# Patient Record
Sex: Female | Born: 1935 | Race: White | Hispanic: No
Health system: Southern US, Community
[De-identification: ages and names within clinical notes are randomized; demographics above are authoritative.]

---

## 2007-04-17 ENCOUNTER — Inpatient Hospital Stay: Payer: Self-pay | Admitting: Internal Medicine

## 2007-04-17 ENCOUNTER — Other Ambulatory Visit: Payer: Self-pay

## 2007-05-16 ENCOUNTER — Emergency Department: Payer: Self-pay | Admitting: Emergency Medicine

## 2007-05-22 ENCOUNTER — Emergency Department: Payer: Self-pay | Admitting: Internal Medicine

## 2008-08-31 ENCOUNTER — Inpatient Hospital Stay: Payer: Self-pay | Admitting: Internal Medicine

## 2009-01-10 IMAGING — CT CT HEAD WITHOUT CONTRAST
2 of 4 series · 16 of 30 positions shown, 19 images · non-contrast
Comparison: none

REASON FOR EXAM: fall / loc
COMMENTS:

[Series 2: without · axial · non-contrast · 0.38mm/px · z∈[+891,+1011]mm · 10 of 30 slices shown, 13 images]
[im 3/30  brain]
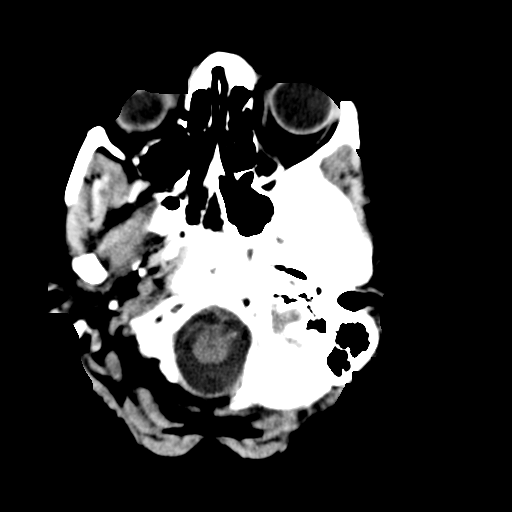
[im 3/30  bone]
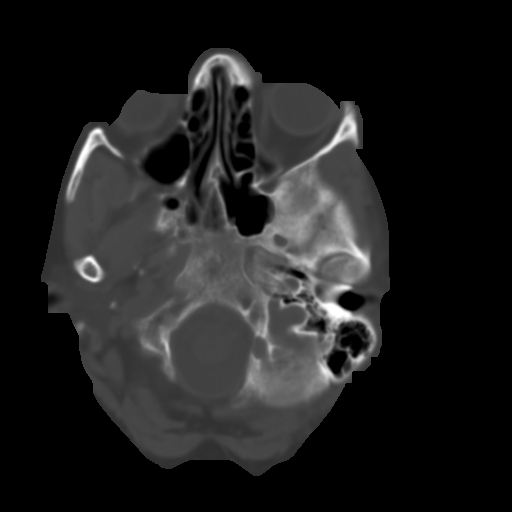
[im 6/30  brain]
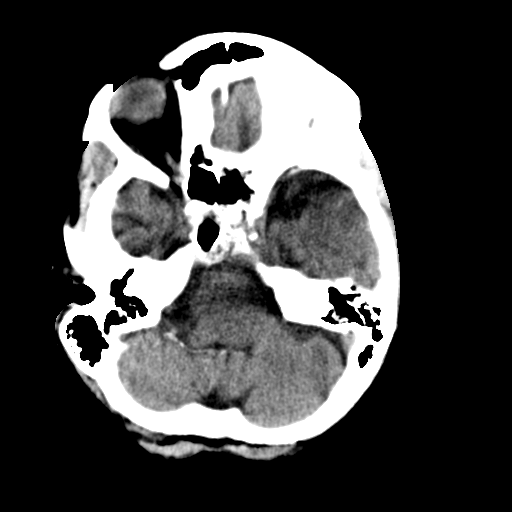
[im 8/30  brain]
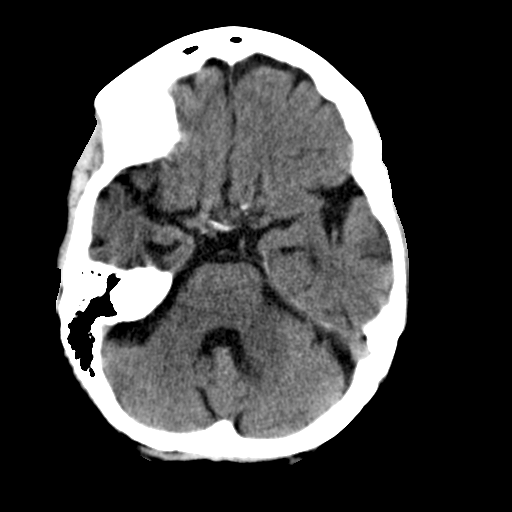
[im 11/30  brain]
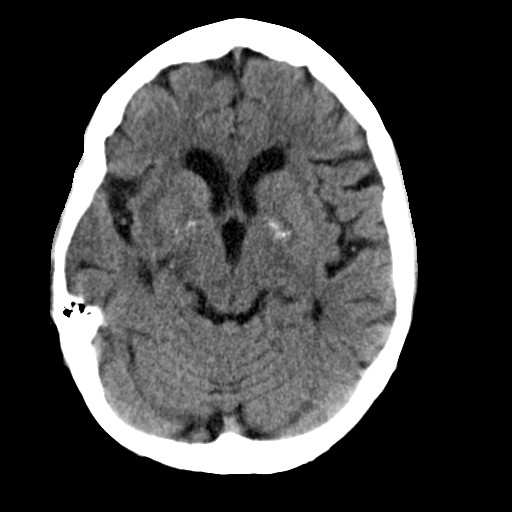
[im 14/30  brain]
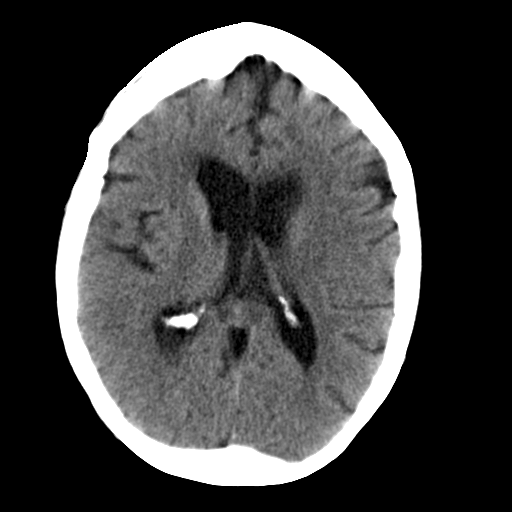
[im 14/30  bone]
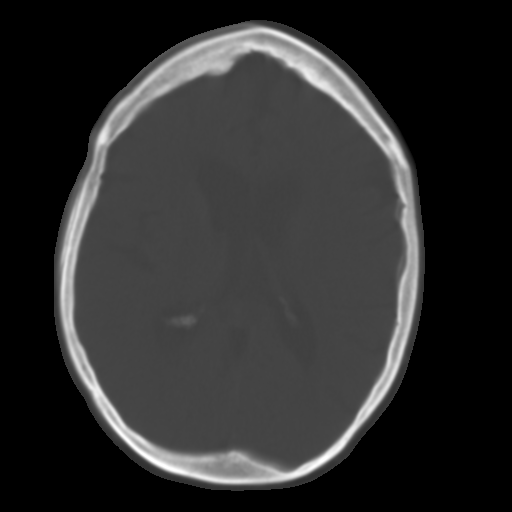
[im 16/30  brain]
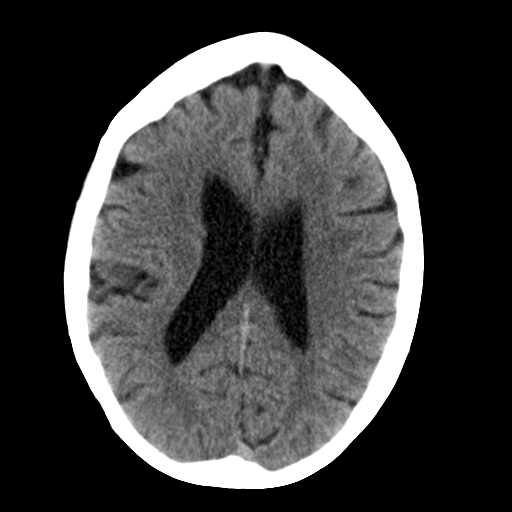
[im 19/30  brain]
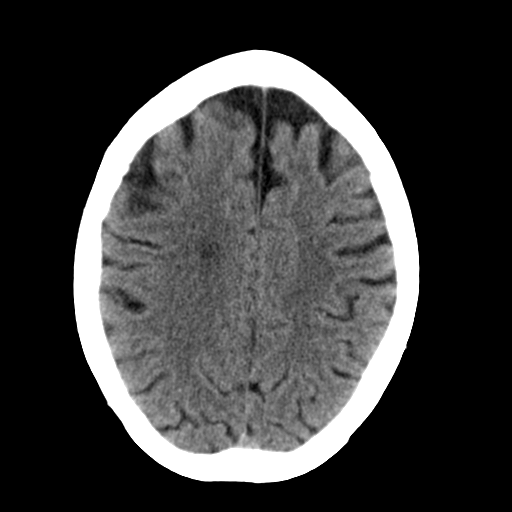
[im 22/30  brain]
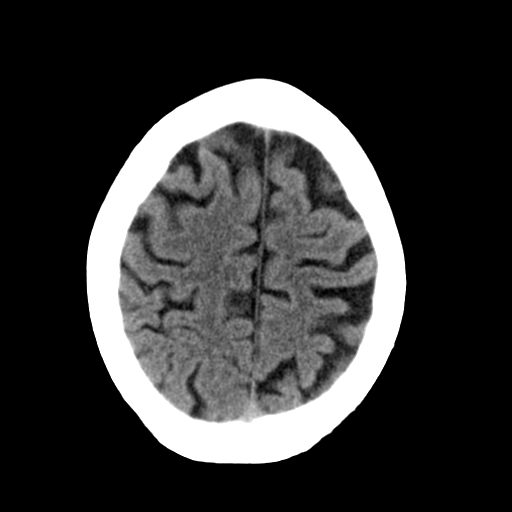
[im 24/30  brain]
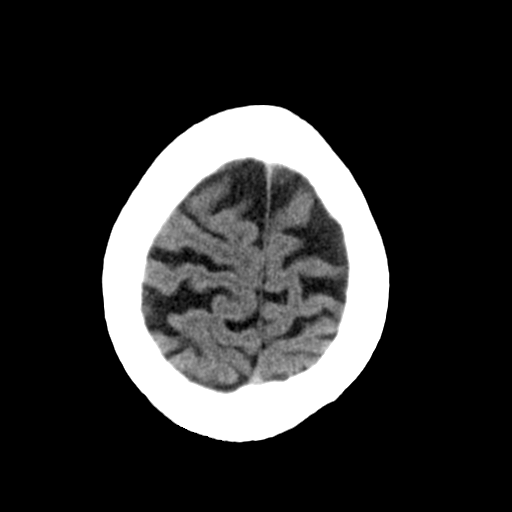
[im 24/30  bone]
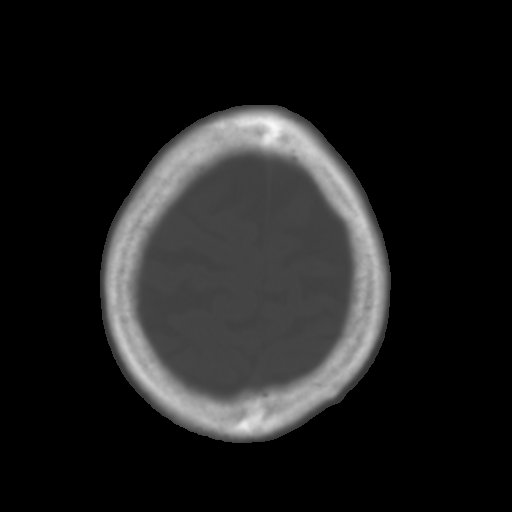
[im 27/30  brain]
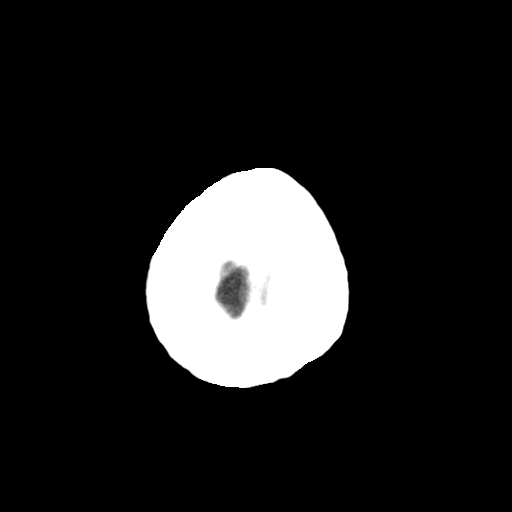

[Series 3: bone · axial · 0.38mm/px · z∈[+891,+971]mm · 6 of 30 slices shown]
[im 3/30  bone]
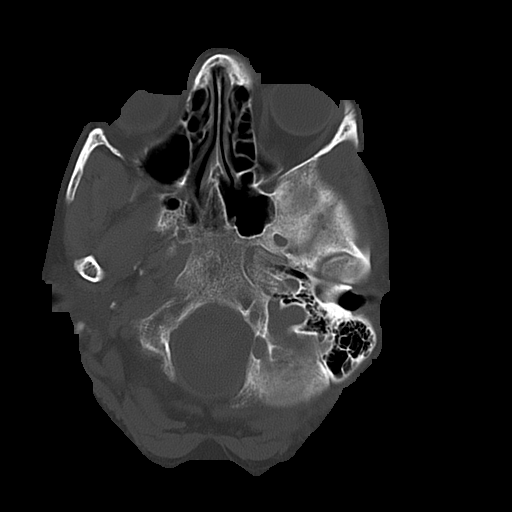
[im 6/30  bone]
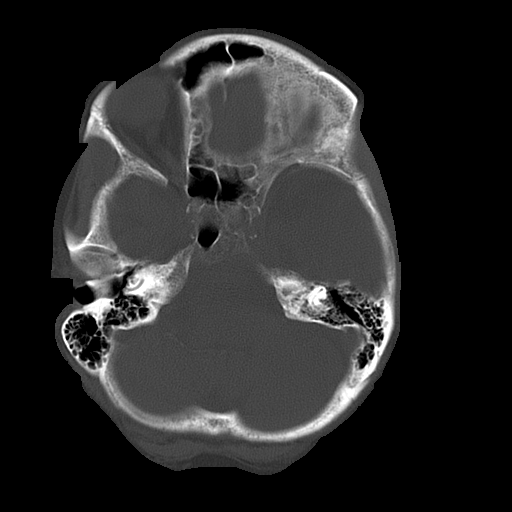
[im 11/30  bone]
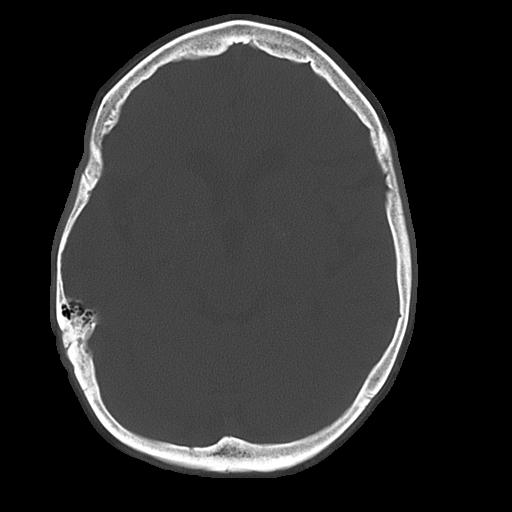
[im 14/30  bone]
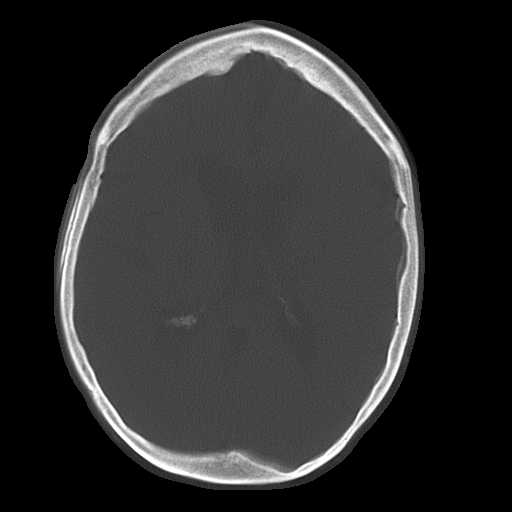
[im 16/30  bone]
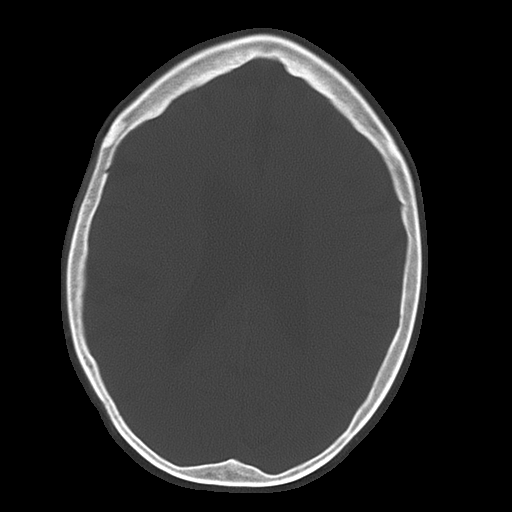
[im 19/30  bone]
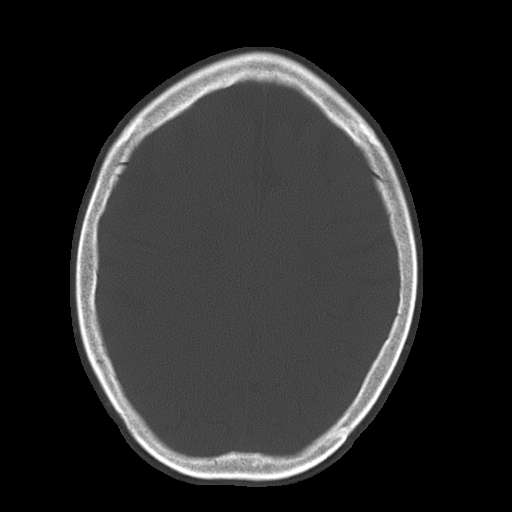

[16 of 30 positions shown; findings below may reference images not displayed]

PROCEDURE:     CT  - CT HEAD WITHOUT CONTRAST  - May 22, 2007  [DATE]

RESULT:     This study was compared to a previous study dated 04-17-07.

There is no evidence of intra-axial nor extra-axial fluid collections nor
evidence of acute hemorrhage. No secondary signs are appreciated to suggest
mass-effect, subacute or chronic infarction. The visualized bony skeleton
demonstrates no evidence of fracture or dislocation. There is diffuse
cortical atrophy and areas of low attenuation within the subcortical deep
and periventricular white matter regions. Basal ganglia calcifications are
identified.
IMPRESSION: 1.Involutional changes without evidence of focal or acute abnormalities.

Dr. Aujla of the Emergency Room was informed of these findings at the
time of the initial interpretation.

## 2009-07-26 ENCOUNTER — Inpatient Hospital Stay: Payer: Self-pay | Admitting: Unknown Physician Specialty

## 2009-07-30 ENCOUNTER — Encounter: Payer: Self-pay | Admitting: Internal Medicine

## 2009-08-17 ENCOUNTER — Emergency Department: Payer: Self-pay | Admitting: Emergency Medicine

## 2009-08-18 ENCOUNTER — Encounter: Payer: Self-pay | Admitting: Internal Medicine

## 2009-09-16 ENCOUNTER — Encounter: Payer: Self-pay | Admitting: Internal Medicine

## 2010-04-30 IMAGING — CR DG CHEST 2V
1 series · 2 of 2 positions shown · non-contrast
Comparison: none

REASON FOR EXAM: cough/fu chest
COMMENTS:

PROCEDURE:     DXR - DXR CHEST PA (OR AP) AND LATERAL  - September 08, 2008 [DATE]
RESULT:     Comparison: 09/06/2008

[Series 1: view not recorded · 0.17mm/px · 2 of 2 slices shown]
[im 1/2]
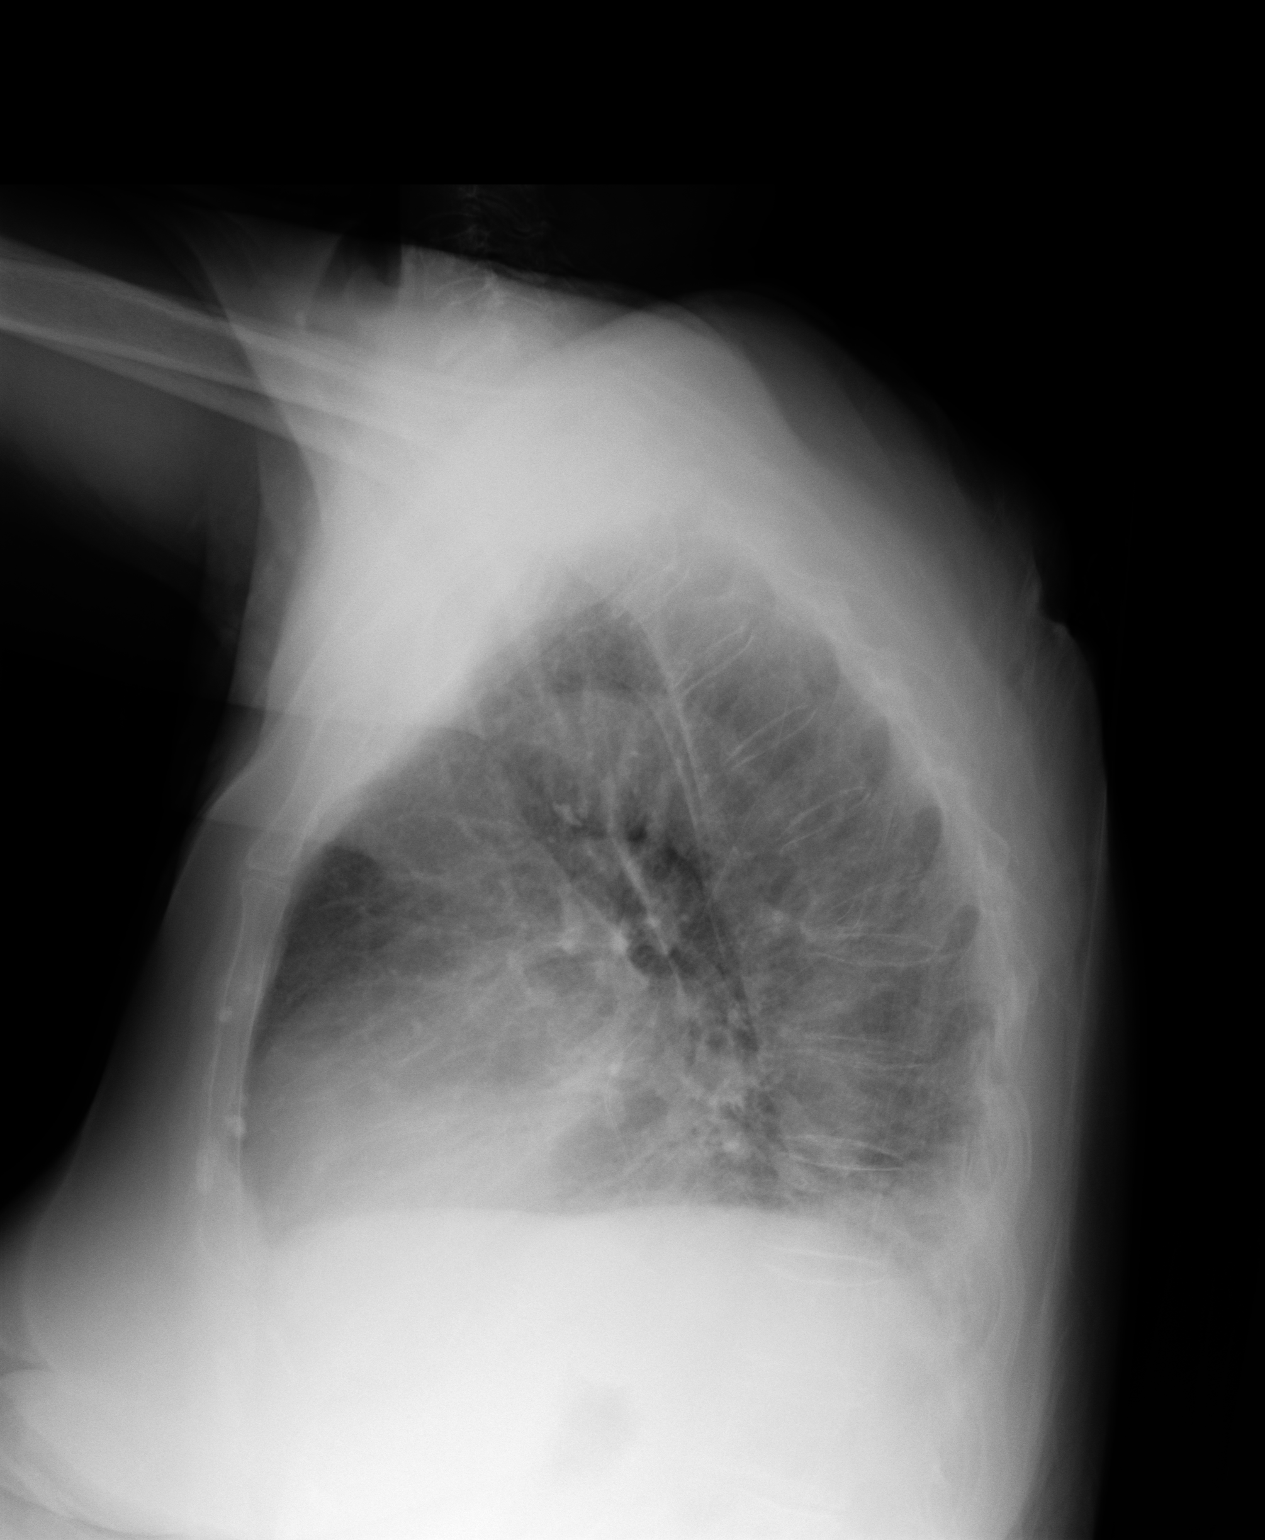
[im 2/2]
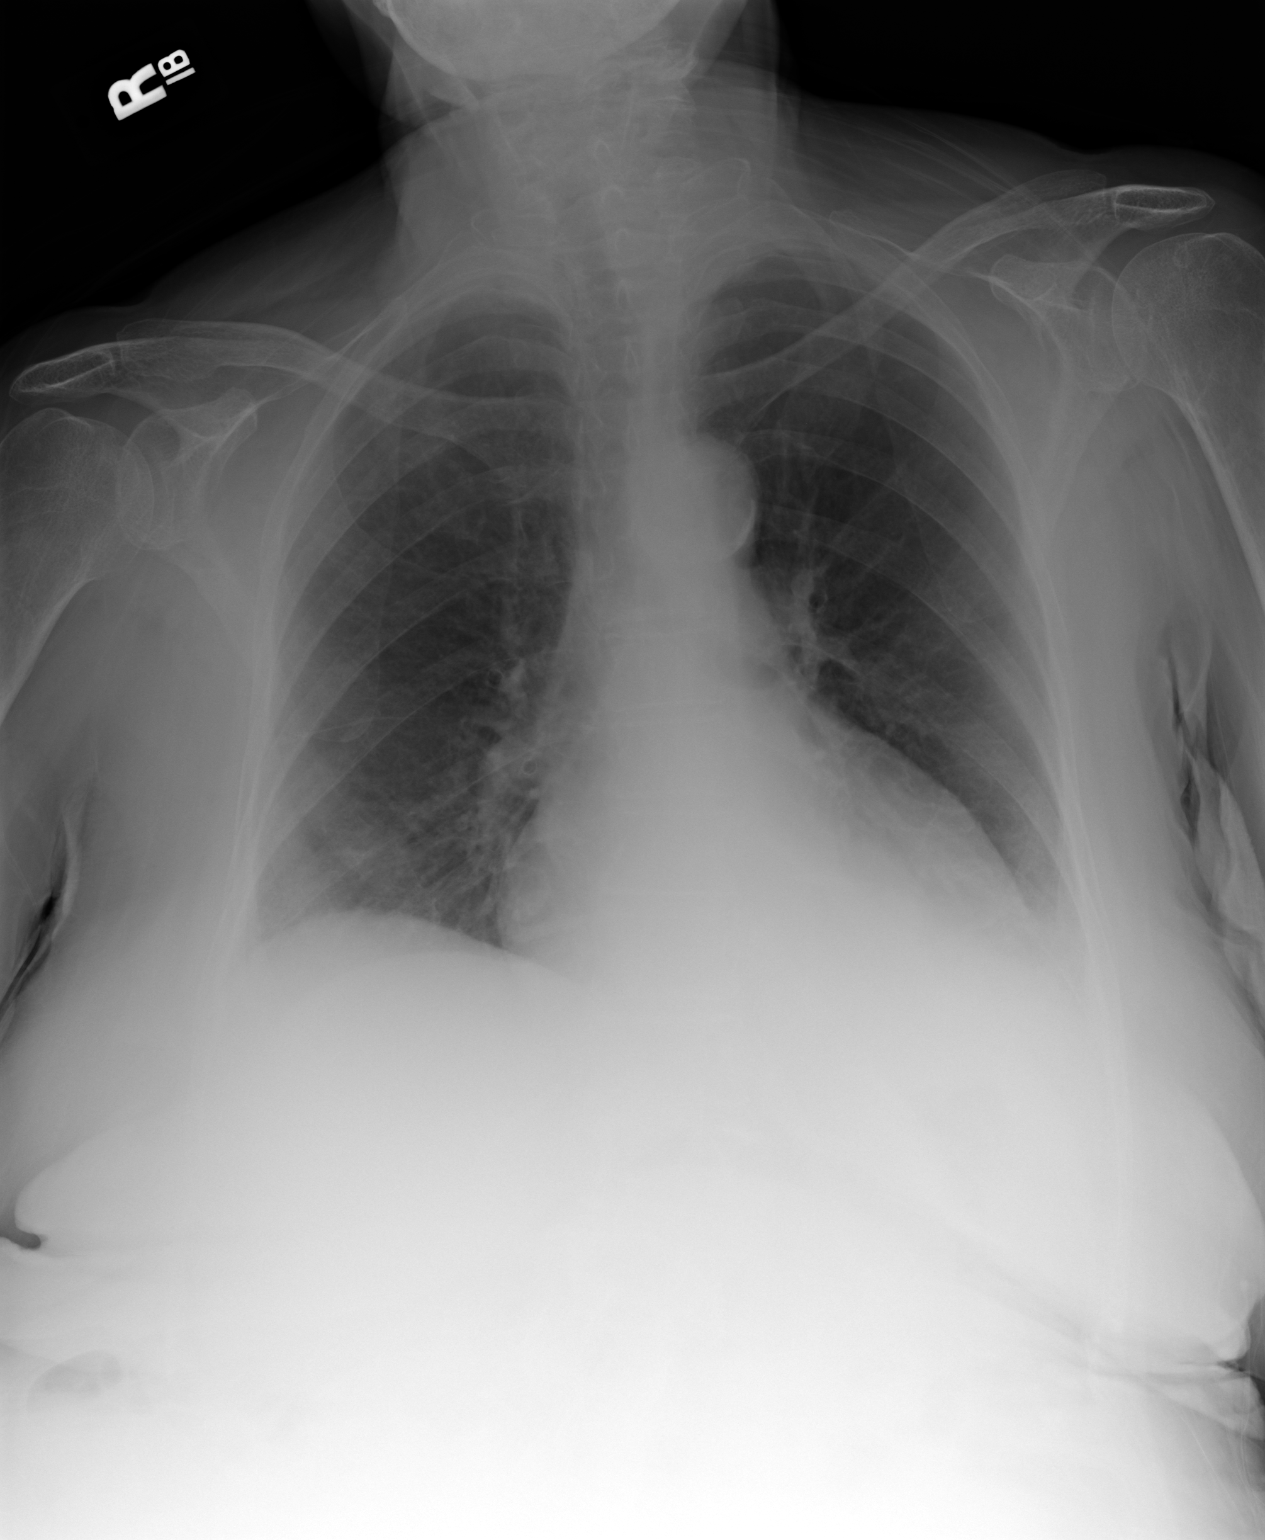

[2 of 2 positions shown; findings below may reference images not displayed]

FINDINGS: PA and lateral chest radiographs are provided. There is persistent small
area of airspace disease in the left lower lobe posteriorly concerning for
an infiltrate. The right lung is clear. There is no pneumothorax. The heart
and mediastinum are unremarkable. The osseous structures are unremarkable.
IMPRESSION: Left lower lobe airspace disease concerning for an infiltrate.

## 2010-11-23 ENCOUNTER — Emergency Department: Payer: Self-pay | Admitting: Internal Medicine

## 2011-01-22 ENCOUNTER — Emergency Department: Payer: Self-pay | Admitting: Emergency Medicine

## 2011-07-18 ENCOUNTER — Ambulatory Visit: Payer: Self-pay | Admitting: Internal Medicine

## 2011-07-30 ENCOUNTER — Inpatient Hospital Stay: Payer: Self-pay | Admitting: Internal Medicine

## 2011-07-30 LAB — URINALYSIS, COMPLETE
Bilirubin,UR: NEGATIVE
Blood: NEGATIVE
Glucose,UR: NEGATIVE mg/dL (ref 0–75)
Ketone: NEGATIVE
Nitrite: POSITIVE
Ph: 6 (ref 4.5–8.0)
RBC,UR: 3 /HPF (ref 0–5)
Specific Gravity: 1.018 (ref 1.003–1.030)
Squamous Epithelial: 1

## 2011-07-30 LAB — COMPREHENSIVE METABOLIC PANEL
Anion Gap: 11 (ref 7–16)
Calcium, Total: 9 mg/dL (ref 8.5–10.1)
Chloride: 106 mmol/L (ref 98–107)
Co2: 24 mmol/L (ref 21–32)
Creatinine: 0.93 mg/dL (ref 0.60–1.30)
EGFR (African American): >60
EGFR (Non-African Amer.): 55 — ABNORMAL LOW
Glucose: 100 mg/dL — ABNORMAL HIGH (ref 65–99)
Osmolality: 283 (ref 275–301)
Potassium: 4.4 mmol/L (ref 3.5–5.1)
SGPT (ALT): 20 U/L
Total Protein: 7.7 g/dL (ref 6.4–8.2)

## 2011-07-30 LAB — TROPONIN I: Troponin-I: <0.02 ng/mL

## 2011-07-30 LAB — PROTIME-INR: Prothrombin Time: 12.9 secs (ref 11.5–14.7)

## 2011-07-30 LAB — CBC
MCV: 99 fL (ref 80–100)
Platelet: 209 10*3/uL (ref 150–440)

## 2011-07-31 LAB — CBC WITH DIFFERENTIAL/PLATELET
Basophil #: 0.1 10*3/uL (ref 0.0–0.1)
Eosinophil %: 0.6 %
HCT: 41.3 % (ref 35.0–47.0)
HGB: 13.7 g/dL (ref 12.0–16.0)
MCH: 32.4 pg (ref 26.0–34.0)
Neutrophil %: 76.2 %
Platelet: 183 10*3/uL (ref 150–440)
RBC: 4.23 10*6/uL (ref 3.80–5.20)
WBC: 8.9 10*3/uL (ref 3.6–11.0)

## 2011-07-31 LAB — BASIC METABOLIC PANEL
Anion Gap: 10 (ref 7–16)
BUN: 18 mg/dL (ref 7–18)
Co2: 24 mmol/L (ref 21–32)
Creatinine: 0.89 mg/dL (ref 0.60–1.30)
EGFR (African American): >60
EGFR (Non-African Amer.): 58 — ABNORMAL LOW
Glucose: 109 mg/dL — ABNORMAL HIGH (ref 65–99)
Sodium: 141 mmol/L (ref 136–145)

## 2011-07-31 LAB — HEMOGLOBIN
HGB: 13.8 g/dL (ref 12.0–16.0)
HGB: 13.2 g/dL (ref 12.0–16.0)

## 2011-08-01 LAB — HEMOGLOBIN
HGB: 12.5 g/dL (ref 12.0–16.0)
HGB: 13.1 g/dL (ref 12.0–16.0)

## 2011-08-02 LAB — BASIC METABOLIC PANEL
Anion Gap: 6 — ABNORMAL LOW (ref 7–16)
BUN: 13 mg/dL (ref 7–18)
Calcium, Total: 8.1 mg/dL — ABNORMAL LOW (ref 8.5–10.1)
Chloride: 109 mmol/L — ABNORMAL HIGH (ref 98–107)
Co2: 25 mmol/L (ref 21–32)
Creatinine: 0.89 mg/dL (ref 0.60–1.30)
EGFR (Non-African Amer.): 58 — ABNORMAL LOW
Glucose: 109 mg/dL — ABNORMAL HIGH (ref 65–99)

## 2011-08-02 LAB — CBC WITH DIFFERENTIAL/PLATELET
Basophil #: 0.1 10*3/uL (ref 0.0–0.1)
Basophil %: 1.4 %
Eosinophil %: 0.7 %
HGB: 13 g/dL (ref 12.0–16.0)
Lymphocyte #: 0.9 10*3/uL — ABNORMAL LOW (ref 1.0–3.6)
Lymphocyte %: 9.5 %
MCH: 32.2 pg (ref 26.0–34.0)
Monocyte %: 10.4 %
Neutrophil #: 7.4 10*3/uL — ABNORMAL HIGH (ref 1.4–6.5)
Neutrophil %: 78 %
Platelet: 164 10*3/uL (ref 150–440)
RDW: 13.6 % (ref 11.5–14.5)
WBC: 9.5 10*3/uL (ref 3.6–11.0)

## 2011-08-02 LAB — HEMOGLOBIN: HGB: 13.7 g/dL (ref 12.0–16.0)

## 2011-08-03 LAB — URINE CULTURE

## 2011-08-03 LAB — HEMOGLOBIN: HGB: 13.5 g/dL (ref 12.0–16.0)

## 2011-08-05 LAB — CULTURE, BLOOD (SINGLE)

## 2011-08-08 ENCOUNTER — Inpatient Hospital Stay: Payer: Self-pay | Admitting: Internal Medicine

## 2011-08-08 LAB — BASIC METABOLIC PANEL
Anion Gap: 6 — ABNORMAL LOW (ref 7–16)
BUN: 18 mg/dL (ref 7–18)
Chloride: 106 mmol/L (ref 98–107)
Co2: 26 mmol/L (ref 21–32)
EGFR (African American): >60
EGFR (Non-African Amer.): >60
Osmolality: 278 (ref 275–301)

## 2011-08-08 LAB — CBC
MCH: 31.4 pg (ref 26.0–34.0)
MCHC: 32.7 g/dL (ref 32.0–36.0)
WBC: 13.5 10*3/uL — ABNORMAL HIGH (ref 3.6–11.0)

## 2011-08-08 LAB — URINALYSIS, COMPLETE
Bacteria: NONE SEEN
Blood: NEGATIVE
Glucose,UR: NEGATIVE mg/dL (ref 0–75)
Ketone: NEGATIVE
Nitrite: NEGATIVE
Ph: 5 (ref 4.5–8.0)
Specific Gravity: 1.024 (ref 1.003–1.030)
Squamous Epithelial: 1

## 2011-08-08 LAB — TROPONIN I: Troponin-I: <0.02 ng/mL

## 2011-08-08 LAB — PRO B NATRIURETIC PEPTIDE: B-Type Natriuretic Peptide: 1530 pg/mL — ABNORMAL HIGH (ref 0–450)

## 2011-08-08 LAB — CK TOTAL AND CKMB (NOT AT ARMC)
CK, Total: 30 U/L (ref 21–215)
CK-MB: 0.8 ng/mL (ref 0.5–3.6)

## 2011-08-08 LAB — TSH: Thyroid Stimulating Horm: 2.9 u[IU]/mL

## 2011-08-09 LAB — COMPREHENSIVE METABOLIC PANEL
Albumin: 2.5 g/dL — ABNORMAL LOW (ref 3.4–5.0)
Alkaline Phosphatase: 91 U/L (ref 50–136)
Anion Gap: 9 (ref 7–16)
Calcium, Total: 8.5 mg/dL (ref 8.5–10.1)
Co2: 25 mmol/L (ref 21–32)
Creatinine: 0.94 mg/dL (ref 0.60–1.30)
EGFR (African American): >60
EGFR (Non-African Amer.): 54 — ABNORMAL LOW
Glucose: 109 mg/dL — ABNORMAL HIGH (ref 65–99)
SGPT (ALT): 17 U/L
Sodium: 137 mmol/L (ref 136–145)

## 2011-08-09 LAB — CBC WITH DIFFERENTIAL/PLATELET
Basophil #: 0.1 10*3/uL (ref 0.0–0.1)
Basophil %: 0.7 %
Eosinophil #: 0.2 10*3/uL (ref 0.0–0.7)
Eosinophil %: 2 %
HCT: 37.4 % (ref 35.0–47.0)
HGB: 12.3 g/dL (ref 12.0–16.0)
Lymphocyte #: 0.6 10*3/uL — ABNORMAL LOW (ref 1.0–3.6)
Lymphocyte %: 6.6 %
MCHC: 32.8 g/dL (ref 32.0–36.0)
Monocyte %: 9 %
Neutrophil #: 7.7 10*3/uL — ABNORMAL HIGH (ref 1.4–6.5)
Neutrophil %: 81.7 %
Platelet: 233 10*3/uL (ref 150–440)
RDW: 13.3 % (ref 11.5–14.5)
WBC: 9.4 10*3/uL (ref 3.6–11.0)

## 2011-08-09 LAB — CK TOTAL AND CKMB (NOT AT ARMC): CK-MB: 0.8 ng/mL (ref 0.5–3.6)

## 2011-08-09 LAB — TROPONIN I: Troponin-I: <0.02 ng/mL

## 2011-08-10 LAB — CBC WITH DIFFERENTIAL/PLATELET
Basophil #: 0.1 10*3/uL (ref 0.0–0.1)
Eosinophil %: 1.8 %
HCT: 39.6 % (ref 35.0–47.0)
HGB: 13 g/dL (ref 12.0–16.0)
Lymphocyte #: 0.8 10*3/uL — ABNORMAL LOW (ref 1.0–3.6)
Lymphocyte %: 8.8 %
MCHC: 32.9 g/dL (ref 32.0–36.0)
MCV: 95 fL (ref 80–100)
Monocyte #: 0.8 x10 3/mm (ref 0.2–0.9)
Monocyte %: 9.9 %
Neutrophil #: 6.7 10*3/uL — ABNORMAL HIGH (ref 1.4–6.5)
Neutrophil %: 78.5 %
RDW: 13.3 % (ref 11.5–14.5)

## 2011-08-10 LAB — PRO B NATRIURETIC PEPTIDE: B-Type Natriuretic Peptide: 825 pg/mL — ABNORMAL HIGH (ref 0–450)

## 2011-08-11 LAB — BASIC METABOLIC PANEL
Anion Gap: 13 (ref 7–16)
Calcium, Total: 8.8 mg/dL (ref 8.5–10.1)
Chloride: 98 mmol/L (ref 98–107)
Creatinine: 1 mg/dL (ref 0.60–1.30)
EGFR (Non-African Amer.): 50 — ABNORMAL LOW
Osmolality: 280 (ref 275–301)
Potassium: 3.2 mmol/L — ABNORMAL LOW (ref 3.5–5.1)

## 2011-08-11 LAB — PRO B NATRIURETIC PEPTIDE: B-Type Natriuretic Peptide: 1127 pg/mL — ABNORMAL HIGH (ref 0–450)

## 2011-08-14 LAB — CULTURE, BLOOD (SINGLE)

## 2011-08-18 ENCOUNTER — Ambulatory Visit: Payer: Self-pay | Admitting: Internal Medicine

## 2011-09-17 DEATH — deceased

## 2013-03-29 IMAGING — CR DG LUMBAR SPINE 2-3V
1 series · 4 of 4 positions shown · non-contrast
Comparison: none

REASON FOR EXAM: severe back pain w/ movement
COMMENTS:

[Series 3: t lumbar spine ap · 0.14mm/px · 4 of 4 slices shown]
[im 1/4]
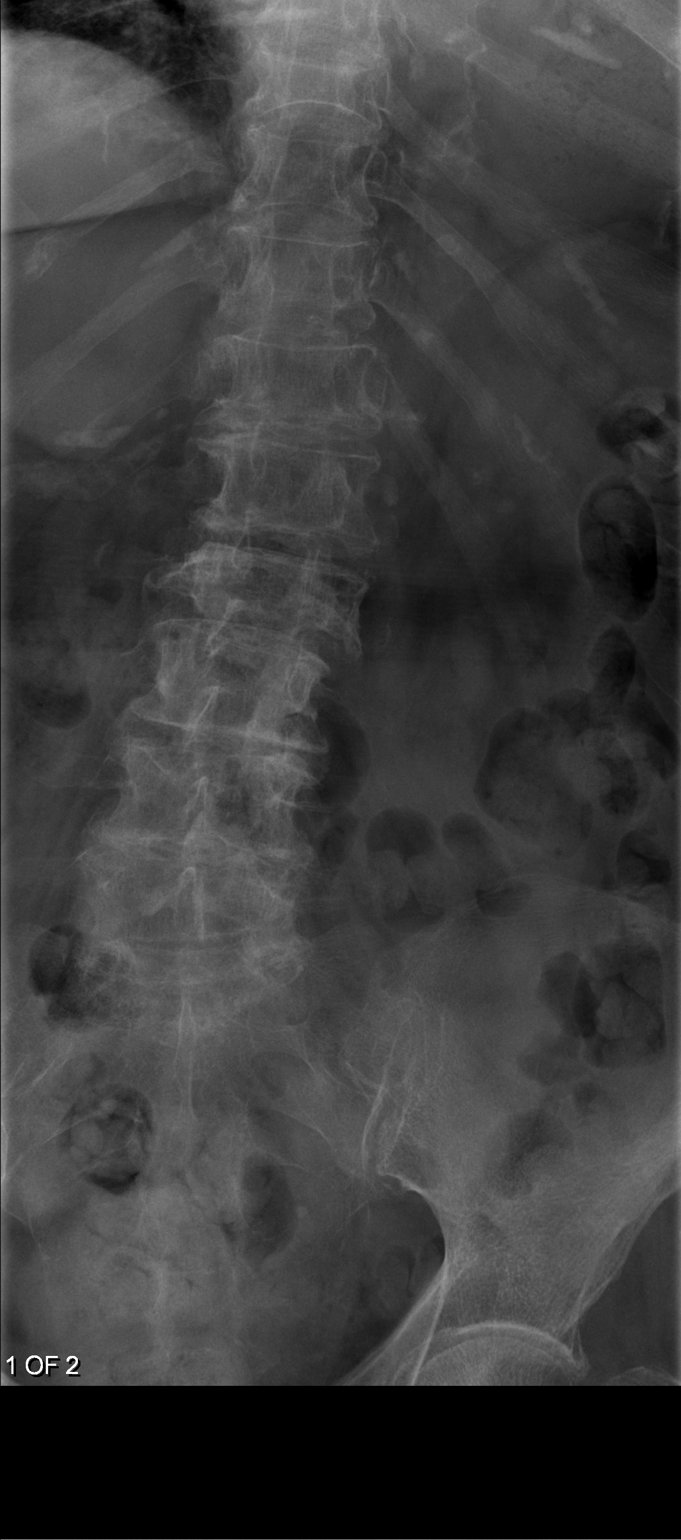
[im 2/4]
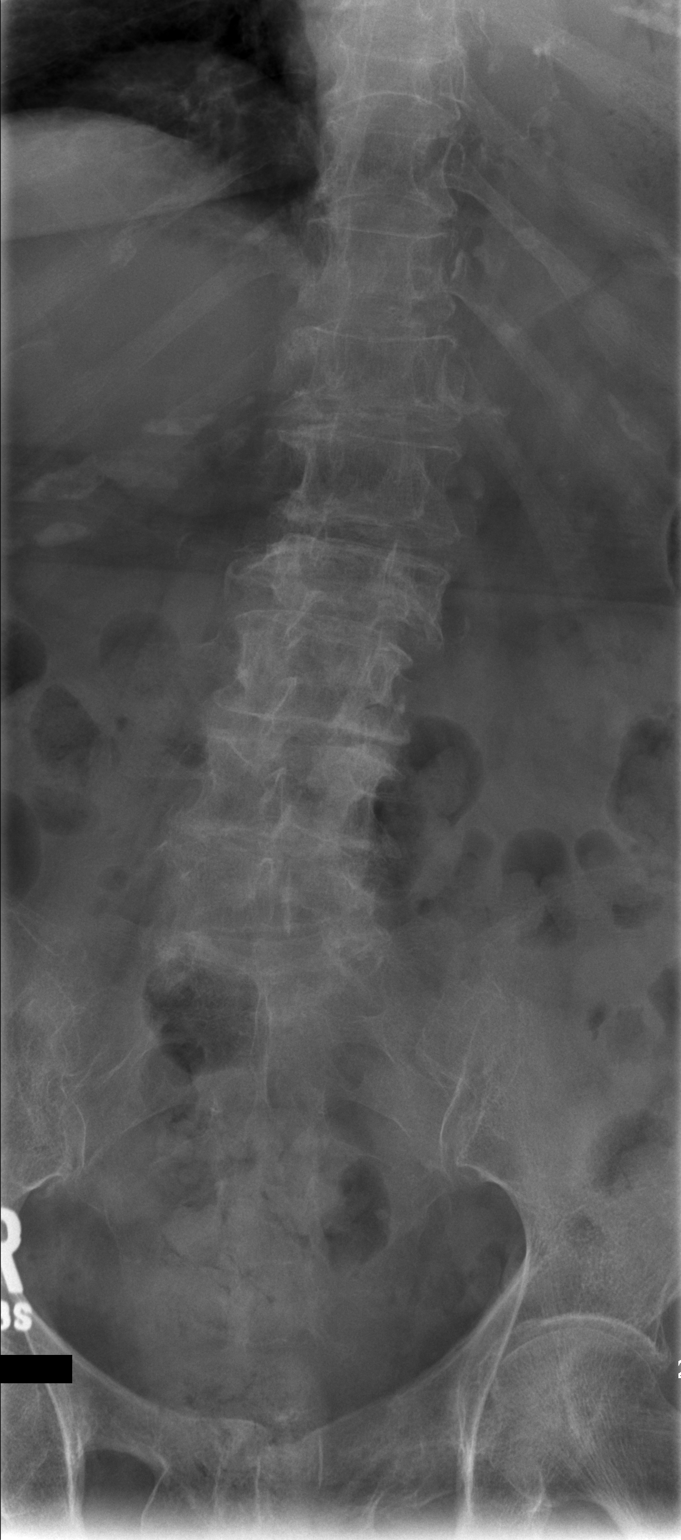
[im 3/4]
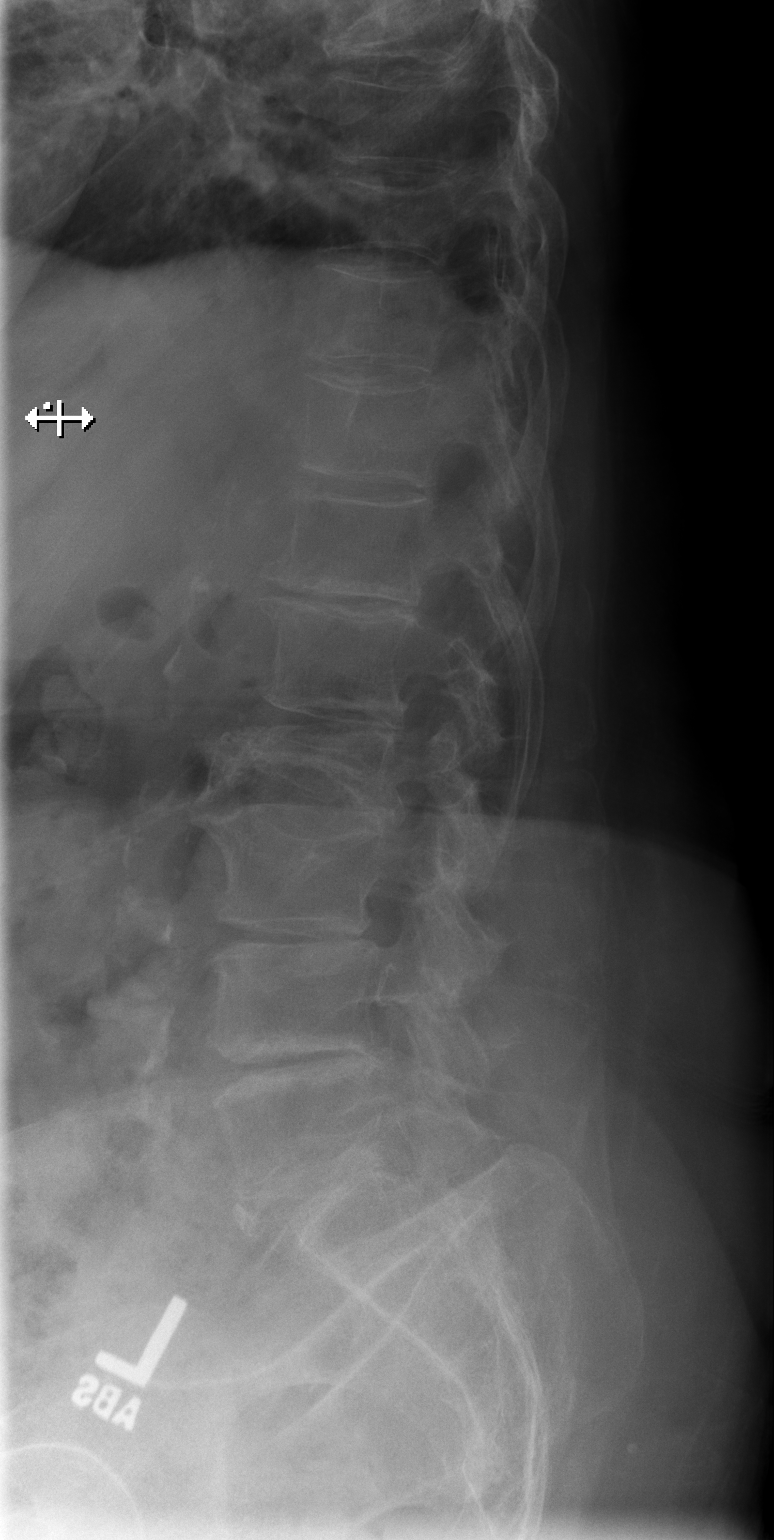
[im 4/4]
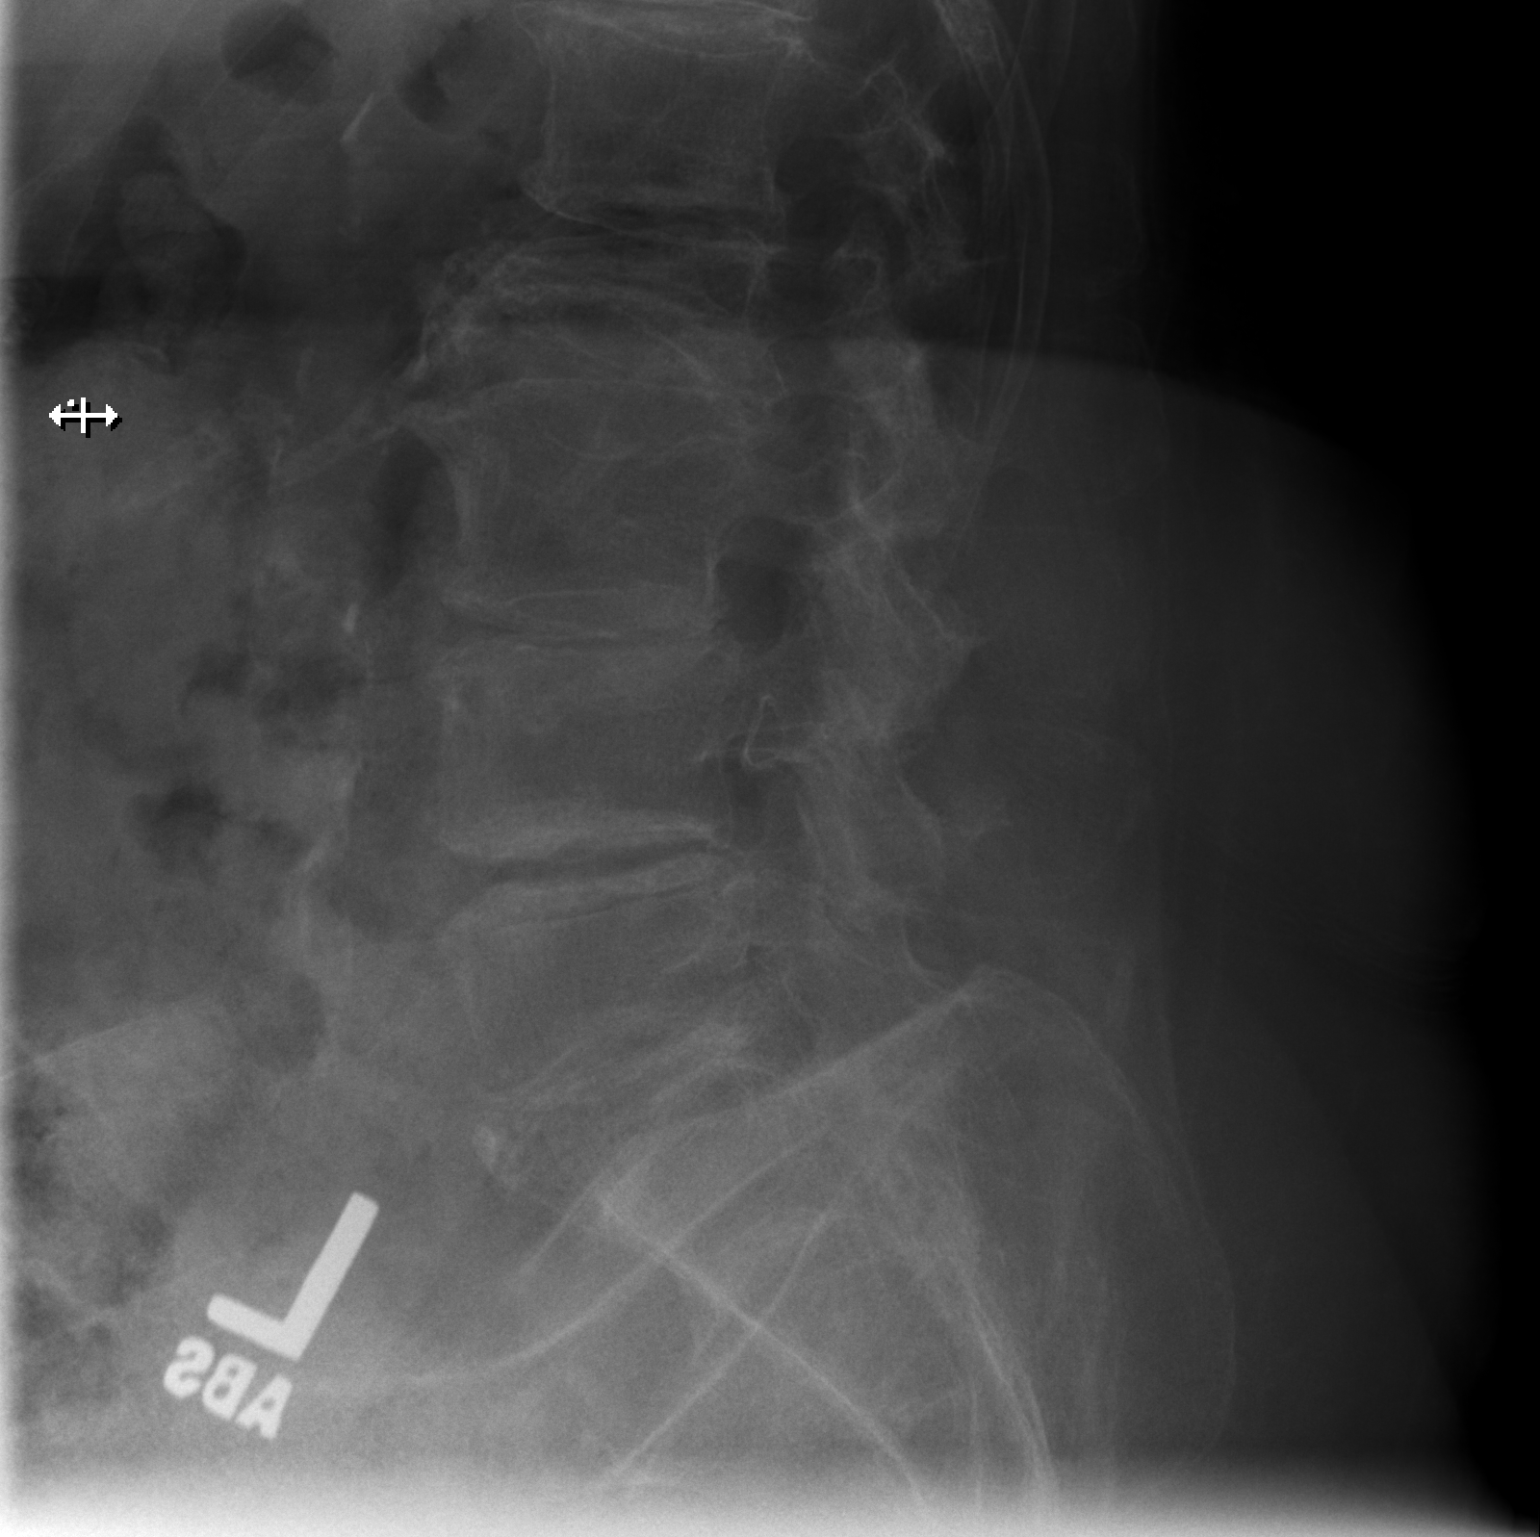

[4 of 4 positions shown; findings below may reference images not displayed]

PROCEDURE:     DXR - DXR LUMBAR SPINE AP AND LATERAL  - August 08, 2011  [DATE]

RESULT:     Comparison is made to prior study dated 07/30/2011. The bones are
osteopenic. Fracture deformity is appreciated involving L2 unchanged when
compared to previous study. There is no evidence of acute fracture or
dislocation. Multilevel degenerative disc disease changes are identified
within the lumbar spine. There is mild levoscoliosis.
IMPRESSION: Chronic changes without evidence of acute abnormalities.

## 2013-03-31 IMAGING — CR DG CHEST 1V PORT
1 series · 1 of 1 positions shown · non-contrast
Comparison: none

REASON FOR EXAM: FU CHF
COMMENTS:

[ap]
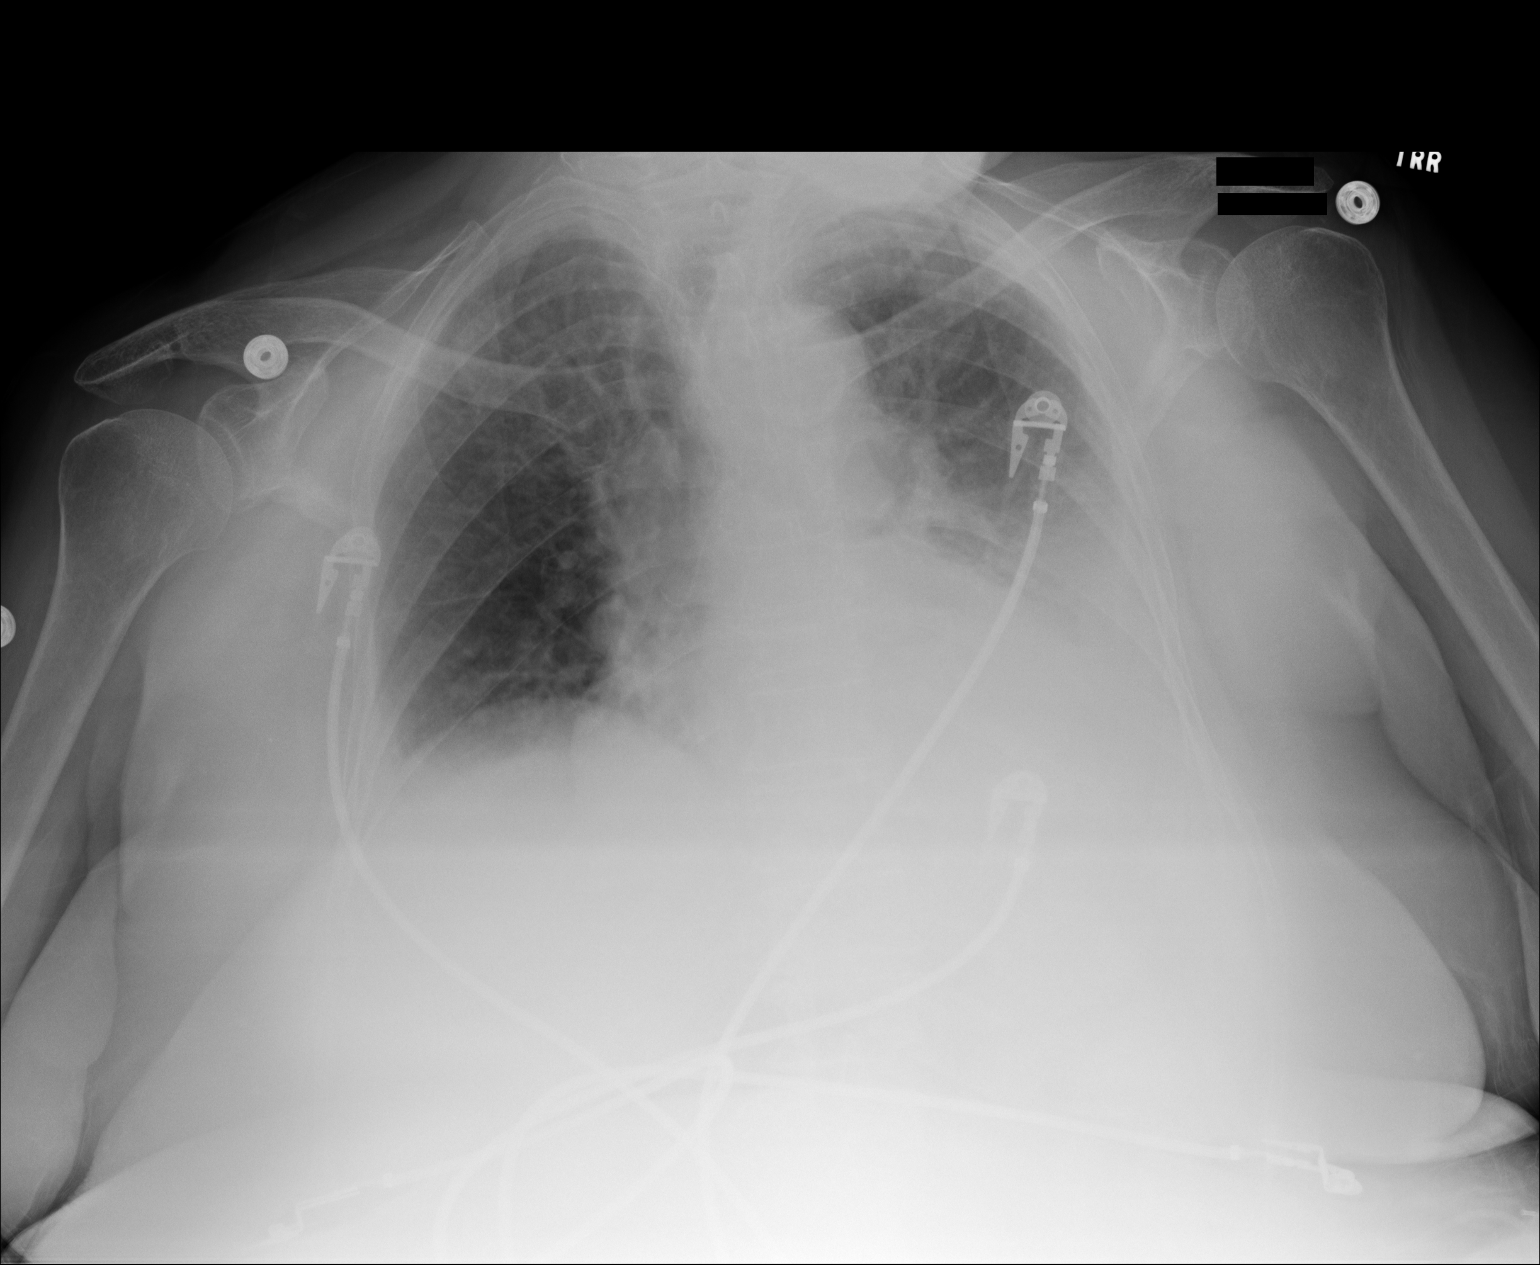

[1 of 1 positions shown; findings below may reference images not displayed]

PROCEDURE:     DXR - DXR PORTABLE CHEST SINGLE VIEW  - August 10, 2011  [DATE]

RESULT:     Comparison is made to the study 08 August, 2011.

The right lung is clear. There is persistent left lung base pneumonia versus
atelectasis. The heart size appears to be grossly normal but is difficult to
assess along the left heart border. There is minimal linear fibrosis or
atelectasis at the right lung base. The bones are osteopenic. Monitoring
electrodes are present.
IMPRESSION: 1. Left lung base pneumonia. Minimal right lung base fibrosis or
atelectasis. Probable underlying left pleural effusion. The compressive
atelectasis in the left lower lobe is not excluded. Correlate clinically.

[REDACTED]

## 2013-04-01 IMAGING — CR DG CHEST 1V PORT
1 series · 1 of 1 positions shown · non-contrast
Comparison: none

REASON FOR EXAM: FU effusion
COMMENTS:

PROCEDURE:     DXR - DXR PORTABLE CHEST SINGLE VIEW  - August 11, 2011  [DATE]
RESULT:     Comparison is made to prior study dated 08/10/2011.

[ap]
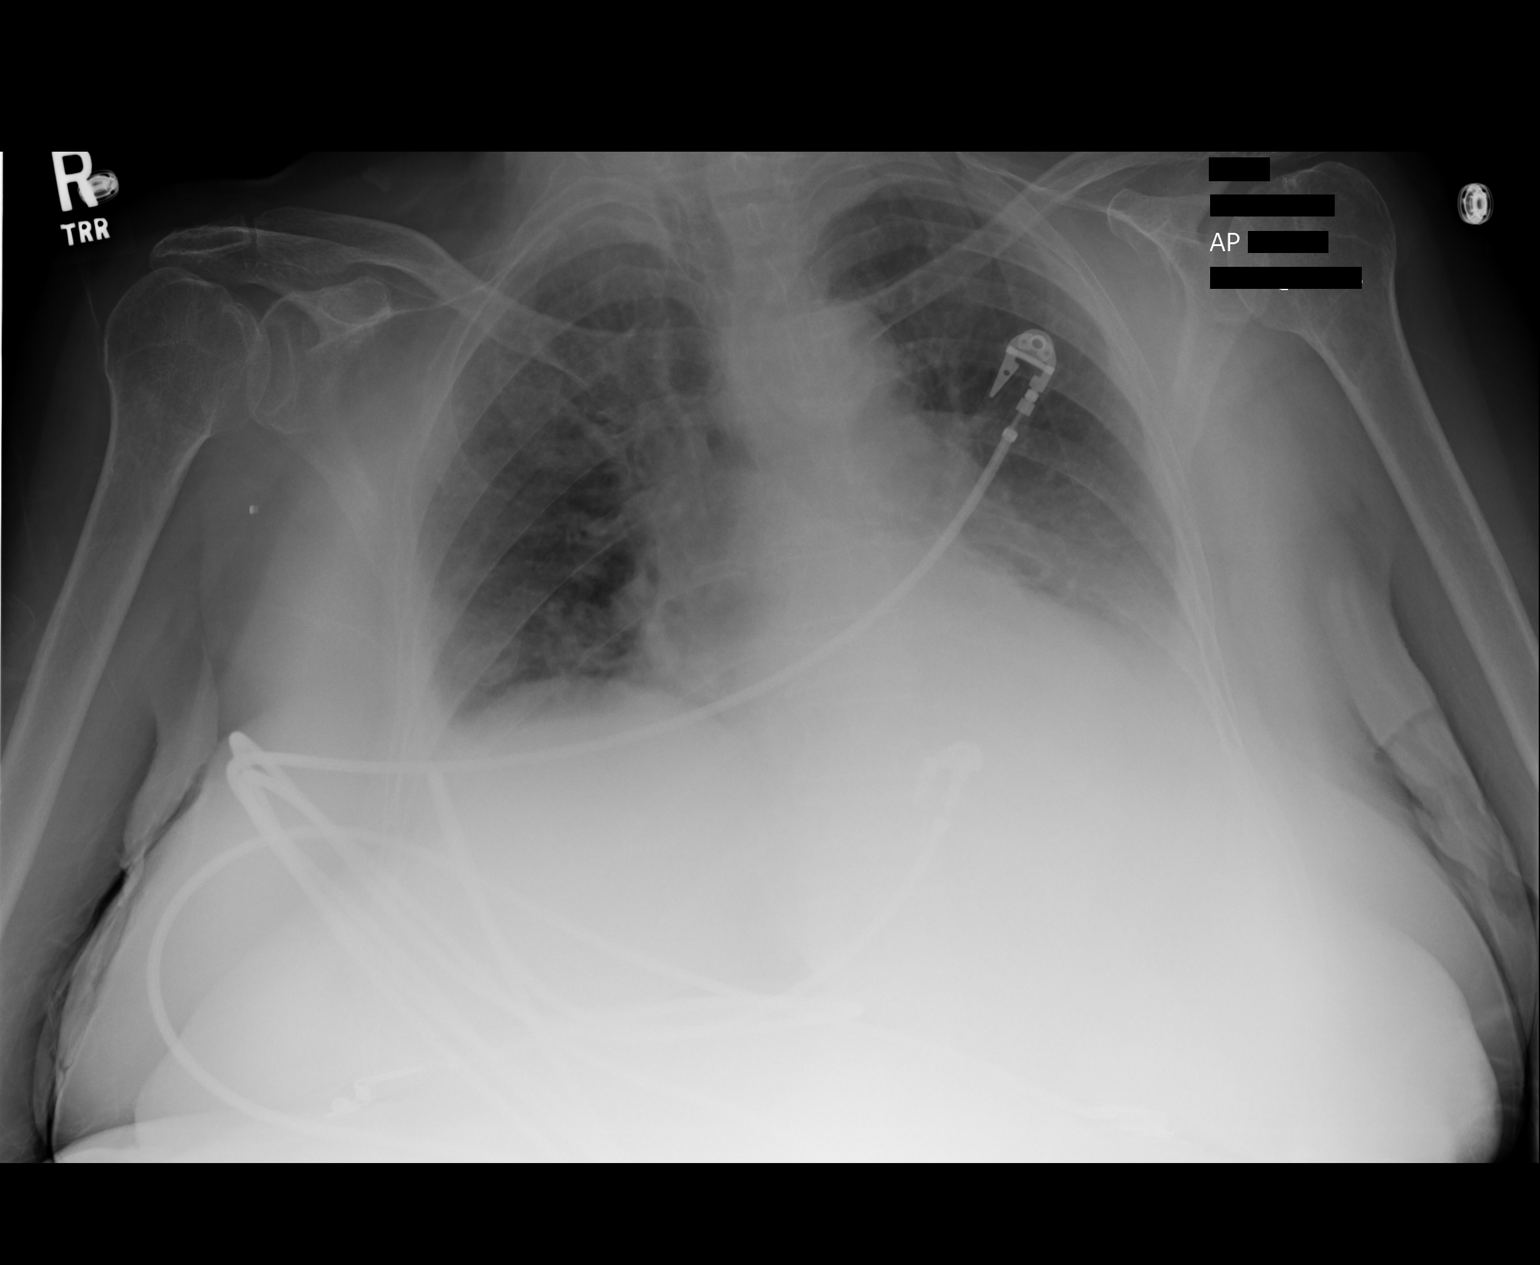

[1 of 1 positions shown; findings below may reference images not displayed]

FINDINGS: The patient has taken a shallow inspiration. An area of increased
density projects within the left lung base with a consolidative component.
There is blunting of the costophrenic angle. There is thickening of the
interstitial markings and peribronchial cuffing. The cardiac silhouette is
moderately enlarged. The visualized bony skeleton is unremarkable.
IMPRESSION: 1. Shallow inspiration.
2. Atelectasis versus infiltrate left lung base with possibly a small
effusion.
3. A component of pulmonary edema is also of diagnostic consideration.

## 2014-07-11 NOTE — H&P (Signed)
PATIENT NAME:  Ashley SessionsGARRETT, Tihanna R MR#:  [id number] DATE OF BIRTH:  December 22, 1920  DATE OF ADMISSION:  08/08/2011  REFERRING PHYSICIAN: Dorothea GlassmanPaul Malinda, MD  PRIMARY CARE PHYSICIAN: Conchita Parison Chaplin, MD  REASON FOR ADMISSION: Hypoxia, congestive heart failure, and possible pneumonia.   HISTORY OF PRESENT ILLNESS: The patient is an 81 year old female with past medical history as listed below who [facility] due to agitation and back pain. She was recently hospitalized at our facility from 05/13 to 08/04/2011 for confusion and mental status change. She was also having multiple falls. During the work-up she was noted to have guaiac-positive stools and was seen by gastroenterology and tested positive for H. pylori. She also had some evidence of a urinary tract infection. She was placed on appropriate treatment and during the hospitalization her mental status waxed and waned. She was discharged on 08/04/2011. Today, according to her brother-in-law, she was yelling out as she was apparently complaining of back pain and since back pain remained uncontrolled she was sent to the ER for further evaluation. While in the ER, a chest x-ray was obtained and revealed an interstitial infiltrate likely representing pulmonary edema with also possible underlying pneumonia. She was noted to be hypoxic on room air with oxygen saturation 88% for which medical services were contacted for further evaluation and for hospital admission. She denies any shortness of breath at this time. She does have some lower extremity edema. She has some pulmonary rales noted on exam. She denies fevers, chills, or cough. She denies any chest pain. Her back pain is now under control as she states that she is lying on her side rather than her back and she also received some pain medications in the ER. Currently she denies any acute complaints at this time or specific complaints whatsoever.   PAST SURGICAL HISTORY:   1. Colonoscopy. 2. Back surgery.  PAST MEDICAL HISTORY:  1. Hypertension with history of hypertensive urgency.  2. History of dementia. 3. Depression/anxiety. 3. Osteoporosis.  4. History of sinusitis syndrome. 5. History of urinary tract infection.  6. Age progressive dementia. 7. Failure to thrive.  8. Positive H. pylori for which she was started on antibiotics recently.  9. Suspected upper GI bleed during recent hospitalization, May 2013. 10. Iron deficiency anemia.  11. History of left distal radial fracture and pelvic fracture.  12. History of large villous adenoma of the rectum.  13. Arthritis.  ALLERGIES: Aspirin.   MEDICATIONS:  1. Biaxin 500 mg p.o. twice a day for 10 days, started on 08/05/2011. 2. Buspirone 5 mg p.o. daily at 2:00 p.m.  3. Donepezil 5 mg daily at bedtime. 4. Ferocon 1 capsule p.o. twice a day. 5. Fluticasone 50 mcg inhaled two sprays to each nostril daily.  6. Fortical 200 international units nasal spray one spray to alternating nostrils daily.  7. Lorazepam 0.5 mg p.o. every 4 hours p.r.n. agitation or anxiety.  8. Metoprolol 25 mg p.o. twice a day. 9. Oyster shell calcium with vitamin D 500 mg/200 international units 1 tab p.o. twice a day. 10. Protonix 40 mg p.o. twice a day. 11. Risperidone 0.5 mg p.o. daily (at bedtime). 12. Sertraline 50 mg p.o. at bedtime. 13. Tramadol 50 mg p.o. every 8 hours p.r.n. pain.  14. Tussin DM 10 mg/100 mg/5 mL take 1 teaspoon p.o. every 6 hours p.r.n. cough.   FAMILY HISTORY: Per old chart review, both parents had unspecified heart disease.   SOCIAL HISTORY: Negative for tobacco, alcohol, or  illicit drug use. The patient is a resident of [facility] facility. She comes in accompanied by her brother in law today.   REVIEW OF SYSTEMS: CONSTITUTIONAL: She denies any fevers, chills, or recent changes in weight or weakness. Denies any pain at this time. HEAD/EYES: Denies headache or blurry vision. ENT: Denies  tinnitus, earache, nasal discharge, or sore throat. RESPIRATORY: Denies shortness breath, cough, or wheezing. CARDIOVASCULAR: Denies chest pain, heart palpitations, or lower extremity edema. GASTROINTESTINAL: Denies nausea, vomiting, diarrhea, constipation, melena, hematochezia, or abdominal pain. GENITOURINARY: Denies dysuria or hematuria. ENDOCRINE: Denies heat or cold intolerance. HEME/LYMPH: Denies any bruising or bleeding. INTEGUMENTARY: Denies rash or lesions. MUSCULOSKELETAL: Has had some back pain which is currently under control in the ER. Denies muscle weakness. NEUROLOGIC: Denies headache, numbness, weakness, tingling, or dysarthria. PSYCHIATRIC: Denies depression or anxiety but has a history of it on her chart. Denies suicidal or homicidal ideation.   PHYSICAL EXAMINATION:  VITALS: Temperature 96.3, pulse 88, blood pressure 172/75, respirations 18, oxygen saturation 88% on room air and 94% on 1 liter nasal cannula.  GENERAL: The patient is alert and oriented, not acutely distressed.   HEENT: Normocephalic, atraumatic. Pupils are equal, round, and reactive to light and accommodation. Extraocular muscles are intact. Anicteric sclerae. Conjunctivae pink. Hearing intact to voice. Nares without drainage. Oral mucosa is moist without lesions.  NECK: Supple with full range of motion. No JVD, lymphadenopathy, or carotid bruits bilaterally. No thyromegaly or tenderness to palpation over the thyroid gland.   CHEST: Normal respiratory effort without use of accessory respiratory muscles. Bilateral rales without wheezing or rhonchi.   CARDIOVASCULAR: S1 and S2 positive. Regular rate and rhythm. No murmurs, rubs, or gallops. PMI is non-lateralized.   ABDOMEN: Soft, nontender, and nondistended. Normoactive bowel sounds. No hepatosplenomegaly or palpable masses. No hernias.   EXTREMITIES: 2+ pitting edema of bilateral lower extremities without clubbing, cyanosis, or edema; warm. No tenderness to  palpation involving legs bilaterally. Pedal pulses are palpable bilaterally.   SKIN: No suspicious rashes. Skin turgor is good.   LYMPH: No cervical lymphadenopathy.  NEURO: The patient is alert and oriented to person and place, not to exact time or date. Cranial nerves II through XII grossly intact. No focal deficits.  PSYCH: Flattened affect.  LABS/STUDIES: Chest x-ray PA and lateral: Interstitial infiltrate likely representing pulmonary edema, atelectasis versus infiltrate in the left lung base. Underlying effusion is of diagnostic consideration as consolidative component cannot be excluded. When compared to previous study these findings also appear likely secondary to hiatal hernia.  Urinalysis: Hazy urine with negative nitrite and leukocyte esterase, less than 1 WBC, no bacteria.   BMP within normal limits except for serum calcium slightly low at 8.3. We do not have an albumin level at this time to get our corrected calcium, but this will be ordered and followed.   CBC within normal limits, except for WBC 13.5.   Troponin less than 0.02.   TSH 2.9.   BNP 1530.  EKG: Normal sinus rhythm, sinus arrhythmia, heart rate 78 beats per minute, normal axis, normal intervals, nonspecific ST and T wave changes. No acute ischemic changes noted.   ASSESSMENT AND PLAN: An 81 year old female with past medical history of hypertension, osteoporosis, dementia, arthritis, and iron deficiency anemia with rectal villous adenoma, recently noted to have H. pylori positivity as well as recent urinary tract infection sent from [facility] facility due to intractable back pain and also agitation here with: 1. Acute hypoxic respiratory failure - suspect due  to CHF and possible pneumonia and recommend hospital admission for further evaluation and management. She denies any shortness of breath at this time. She also denies any fevers, chills, or cough. She appears to be asymptomatic but abnormalities were noted  on chest x-ray and she is also hypoxic and she also has elevated BNP and recommend further evaluation and management. We will keep her on supplemental oxygen via nasal cannula for now and titrate to keep her oxygen saturations greater than 90% and please see below for further details in regards to management of her suspect CHF and pneumonia.  2. New onset/acute congestive heart failure - not sure if the patient has underlying systolic or diastolic dysfunction. We will obtain 2-D echocardiogram for further assessment. We will cycle her cardiac enzymes. We will provide a low sodium diet. For now we will start diuresis with IV Lasix and monitor ins and outs and daily weights. Consider ACE inhibitor but await echocardiogram first. Continue beta blocker. Obtain cardiology consultation for further recommendations. 3. Pneumonia - with possible healthcare-associated pneumonia for which blood cultures will be obtained and she was started on IV Zosyn. As above she is asymptomatic and denies shortness of breath, cough, fevers, or chills. She does however have leukocytosis and this could be secondary to pneumonia; therefore, we will go ahead and treat at this time.  4. Leukocytosis - as above could be secondary to pneumonia, although she denies shortness of breath, cough, fever, or chills. Blood cultures have been obtained and will be followed and she will be started on IV antibiotics and we will follow her WBC count closely.  5. Hypertension - continue metoprolol and consider ACE inhibitor if congestive heart failure is confirmed. Also write for p.r.n. IV hydralazine and monitor blood pressure closely. Could also consider nitrate therapy.  6. Iron deficiency anemia - continue iron supplement and follow hemoglobin and hematocrit. Currently she is not anemic.  7. Back pain - currently she is comfortable after receiving pain medication in the ER. She may need radiographs of her back but will defer this to the primary team  and she currently appears to be comfortable and will write for p.r.n. pain control. She denies any lower extremity numbness, weakness, or tingling.  8. Dementia with depression/anxiety - continue Aricept for dementia. Continue Risperdal and sertraline and also continue buspirone and p.r.n. Ativan.  9. Recent H. pylori gastritis - continue Biaxin, also continue proton pump inhibitor.  10. DVT prophylaxis with Lovenox.            CODE STATUS: FULL CODE.   TIME SPENT ON ADMISSION: Approximately 60 minutes. ____________________________ Elon Alas, MD knl:slb D: 08/08/2011 20:42:49 ET T: 08/09/2011 09:56:54 ET JOB#: [id number]  cc: Elon Alas, MD, <Dictator> Jimmie Molly. Candelaria Stagers, MD Elon Alas MD ELECTRONICALLY SIGNED 08/27/2011 19:38

## 2014-07-11 NOTE — Consult Note (Signed)
Chief Complaint:   Subjective/Chief Complaint no c/o, doing well, no further bleeding.   VITAL SIGNS/ANCILLARY NOTES: **Vital Signs.:   17-May-13 15:41   Vital Signs Type Routine   Temperature Temperature (F) 98.5   Celsius 36.9   Temperature Source oral   Pulse Pulse 92   Pulse source per Dinamap   Respirations Respirations 20   Systolic BP Systolic BP 163   Diastolic BP (mmHg) Diastolic BP (mmHg) 107   Mean BP 125   BP Source Dinamap   Pulse Ox % Pulse Ox % 92   Pulse Ox Activity Level  At rest   Oxygen Delivery Room Air/ 21 %  *Intake and Output.:   17-May-13 09:49   Stool  small amount of dark greenish stool when wiped    12:18   Stool  small dark green stool    15:23   Stool  small, dark/black, watery stool   Brief Assessment:   Cardiac Regular    Respiratory clear BS    Gastrointestinal details normal Soft  Nontender  Nondistended  No masses palpable  Bowel sounds normal   Routine Hem:  17-May-13 05:01    Hemoglobin (CBC) 13.5   Assessment/Plan:  Assessment/Plan:   Assessment 1) melena/upper gi bleed.  stable, improving hgb without tfx.  Helicobacter pylori serology positive.    Plan 1) currently under tx for uti with cipro.  Once that course is done, will need 10 day course of biaxin 500 mg bid, amoxicillin 1000 mg bid and continue bid ppi.  After finish of the 10 day treatmant course, continue bid ppi until fu with GI or PMD.  Would continue once a day ppi thereafter.   Will sign off, Dr Niel HummerIftikhar covering over the weekend if needed.   Electronic Signatures: Barnetta ChapelSkulskie, Kenleigh Toback (MD)  (Signed 17-May-13 15:54)  Authored: Chief Complaint, VITAL SIGNS/ANCILLARY NOTES, Brief Assessment, Lab Results, Assessment/Plan   Last Updated: 17-May-13 15:54 by Barnetta ChapelSkulskie, Elanda Garmany (MD)

## 2014-07-11 NOTE — Discharge Summary (Signed)
PATIENT NAME:  Ashley Bender, Ashley Bender MR#:  [id number] DATE OF BIRTH:  05-16-1920  DATE OF ADMISSION:  07/30/2011 DATE OF DISCHARGE:  08/04/2011  HOSPITAL COURSE: [name] is an 81 year old female with age-progressive dementia who lives in an assisted living specializing in dementia patients, [facility]. For several days she has not been herself and more disoriented, more confused. She has had multiple little falls, and she was brought to the Emergency Room for further evaluation. During the examination, there was finding of a guaiac positive stool and she had a GI evaluation. She had a previously known polyp in her rectum. There was no obvious nausea or vomiting. Her renal function was normal. She was seen by the gastroenterologist, Dr. Candace Cruise and his team, discussing with her and with her Power of [place], her  sister, intervention. Endoscopies were felt to be not warranted. Her H. pylori came back positive, so she was treated initially with IV PPIs for her guaiac stools, suspecting upper gastrointestinal bleed. She had some evidence of a urinary tract infection and had been on Cipro, so it was felt that after the Cipro was finished she would need a treatment for her H. pylori. During the hospitalization, her mental status waxed and waned. She participated with physical therapy at times and at other times she was less compliant. She had some previous history of diabetes and glucose intolerance but was stable without requiring medication, dietary control.   LABORATORY, DIAGNOSTIC AND RADIOLOGICAL DATA:  Sugar was 100, BUN 18, creatinine 0.93, sodium 141, potassium 4.4. EGFR was 55, corrected to 58 over the hospitalization.  White count was 13,000 on admission. Hemoglobin was 15.4. Hemoglobin remained stable but was 13.8 at discharge. She had no significant sign of blood loss despite the guaiac-positive stools.  Last electrolytes done on May 16th: Sugar was 109, BUN 13, creatinine 189, sodium 140, potassium  4.0. EGFR was up to 58.  Her urine culture initially showed contaminant, suggested fecal. Blood cultures were negative.  Follow-up urine culture on May 16th showed no growth.  Her H. pylori was positive.  Her EKG showed sinus rhythm with first-degree AV block.  CT scan of the head showed age-related changes. No acute process.  She did have x-rays of the pelvis, with a history of falling. There were some old fractures that had healed well.  PA and lateral chest: No acute changes. There was an old 80% central anterior vertebral compression fracture in the midthoracic area.   VITAL SIGNS:  Pulse oximetry was 93%. Blood pressure ranged 134/68. Temperature 98.3.   DISCHARGE DIAGNOSES:  1. Suspected upper gastrointestinal bleed.  2. Positive Helicobacter pylori. 3. Failure to thrive.  4. Age-progressive dementia.  5. Possible urinary tract infection, cleared at the time of discharge. 6. History of sinusitis syndrome. 7. History of osteoporosis. 8. History of hypertension. 9. History of dementia with anxiety-depressive syndrome.   DISCHARGE MEDICATIONS:  1. Flonase 2 puffs per nostril per day.  2. Fortical 2000 units nasal spray per day. 3. Ferricon 0.5 mg capsule b.i.d.   4. Metaproterenol tartrate 25 mg b.i.d.  5. Calcium 500 mg with vitamin D 2000 mg b.i.d. 6. Buspirone 5 mg t.i.d.  7. Aricept 5 mg daily.  8. Risperidone 0.5 mg daily.  9. Sertraline 50 mg daily. 10. Lorazepam 0.5 mg every 4 hours p.r.n. for extreme agitation.  11. Tramadol 50 mg every 8 hours p.r.n. for pain.  12. Biaxin 500 mg b.i.d. for 10 days. 13. Amoxicillin 1000 mg b.i.d. for 10  days.  14. Protonix 40 mg b.i.d.  NOTE: She was taken off her aspirin.   PROGNOSIS: Overall prognosis remains guarded.   ____________________________ Dianah Field Mable Fill, MD dcc:cbb D: 08/06/2011 08:14:06 ET T: 08/06/2011 10:31:02 ET JOB#: [id number] DON C CHAPLIN MD ELECTRONICALLY SIGNED 08/10/2011 9:21

## 2014-07-11 NOTE — Consult Note (Signed)
Details:    - Admitted for fall, found to have black tarry heme positive stool; due to co-morbid condidtions including advanced dementia, and family's preference for limited invasive procedures, has been treated empirically for UGI bleed. Does not appear to have had further black stools. Is on PPI po bid. Hemoglobins have been normal/stable.  Up in chair with friend of daughter Lelon Frohlich in room- reports patient has been ambulating in halls with PT today. Patient is without complaints at this time.    - Exam: Vitals/labs reviewed & stable. Alert, pleasantly confused, cooperative. Speech clear, no facial droop. Skin w/d/pink without erythema/lesion/rash. Respirations unlabored. Lungs CTAB. S1S2 without mrg, RRR. Periperal pulses 2+. No edema. Abdomen nondistended, soft, nontender. Active BS x 4. No rebound, guarding, or peritoneal signs/HSM.    Notes Assessment and Plan: VSS, no apparent further bleeding. Hemoglobins stable, has been up ambulating with pt and tolerating well. Currently in chair. Agree with bid po Pantoprazole. Will advance diet to [facility] with plans to advance to low residue if tolerating well.   Lab Results:    Routine Hem:  16-May-13 05:25    WBC (CBC) 9.5   RBC (CBC) 4.02   Hemoglobin (CBC) 13.0   Hematocrit (CBC) 39.3   Platelet Count (CBC) 164   MCV 98   MCH 32.2   MCHC 32.9   RDW 13.6   Neutrophil % 78.0   Lymphocyte % 9.5   Monocyte % 10.4   Eosinophil % 0.7   Basophil % 1.4   Neutrophil # 7.4   Lymphocyte # 0.9   Monocyte # 1.0   Eosinophil # 0.1   Basophil # 0.1  Routine Chem:  16-May-13 05:25    Glucose, Serum 109   BUN 13   Creatinine (comp) 0.89   Sodium, Serum 140   Potassium, Serum 4.0   Chloride, Serum 109   CO2, Serum 25   Calcium (Total), Serum 8.1   Anion Gap 6   Osmolality (calc) 280   eGFR (African American) >60   eGFR (Non-African American) 58   Electronic Signatures: Stephens November H (NP)  (Signed 16-May-13 11:57)  Authored: Details,  Lab Results   Last Updated: 16-May-13 11:57 by Theodore Demark (NP)

## 2014-07-11 NOTE — Consult Note (Signed)
Pt seen.note to follow.  Electronic Signatures: Lester CarolinaWilliford, Yaseen Gilberg S (MD)  (Signed on 24-May-13 17:28)  Authored  Last Updated: 24-May-13 17:28 by Lester CarolinaWilliford, Lue Sykora S (MD)

## 2014-07-11 NOTE — Consult Note (Signed)
Chief Complaint:   Subjective/Chief Complaint denies abdominal pain of nausea, no emesis no bm since last night. Marland Kitchen.   VITAL SIGNS/ANCILLARY NOTES: **Vital Signs.:   15-May-13 00:54   Vital Signs Type Q 4hr   Temperature Temperature (F) 97.8   Celsius 36.5   Temperature Source oral   Pulse Pulse 70   Pulse source per Dinamap   Respirations Respirations 18   Systolic BP Systolic BP 146   Diastolic BP (mmHg) Diastolic BP (mmHg) 67   Mean BP 93   BP Source Dinamap   Pulse Ox % Pulse Ox % 90   Pulse Ox Activity Level  At rest   Oxygen Delivery Room Air/ 21 %    05:22   Vital Signs Type Routine   Temperature Temperature (F) 98.2   Celsius 36.7   Temperature Source oral   Pulse Pulse 80   Pulse source per Dinamap   Respirations Respirations 18   Systolic BP Systolic BP 129   Diastolic BP (mmHg) Diastolic BP (mmHg) 82   Mean BP 97   BP Source Dinamap   Systolic BP Systolic BP 180   Diastolic BP (mmHg) Diastolic BP (mmHg) 118   Pulse Pulse Sitting 103   Systolic BP Systolic BP 158   Diastolic BP (mmHg) Diastolic BP (mmHg) 88   Pulse Standing Pulse Standing 101   Pulse Ox % Pulse Ox % 90   Pulse Ox Activity Level  At rest   Oxygen Delivery 2L   Telemetry pattern Cardiac Rhythm Normal sinus rhythm; pattern reported by Telemetry Clerk    09:44   Vital Signs Type Q 4hr   Temperature Temperature (F) 98.1   Celsius 36.7   Temperature Source oral   Pulse Pulse 103   Pulse source per Dinamap   Respirations Respirations 18   Systolic BP Systolic BP 162   Diastolic BP (mmHg) Diastolic BP (mmHg) 86   Mean BP 111   BP Source Dinamap   Pulse Ox % Pulse Ox % 93   Pulse Ox Activity Level  At rest   Oxygen Delivery 3L    12:10   Pulse Ox % Pulse Ox % 91   Pulse Ox Activity Level  At rest   Oxygen Delivery Room Air/ 21 %   Brief Assessment:   Cardiac Regular    Respiratory clear BS    Gastrointestinal details normal Soft  Nontender  Nondistended  No masses palpable  Bowel  sounds normal   Routine Hem:  13-May-13 18:58    Hemoglobin (CBC) 15.4  14-May-13 00:24    Hemoglobin (CBC) 14.2    05:02    Hemoglobin (CBC) 13.7    08:17    Hemoglobin (CBC) 13.8    15:57    Hemoglobin (CBC) 13.2  15-May-13 04:39    Hemoglobin (CBC) 12.5   Assessment/Plan:  Assessment/Plan:   Assessment 1) melena-stable, not recurrent.  hemodynamically stable, slow decline of hgb,?dilutional, no increased evidence of bleeding. no abdominal apin.  on bid ppi.    Plan 1) family wanting to avoid sedated proceedures if possible.  currently stable, on emperic tx for pudz. continue current.  awaiting results of h. pylori serology, stool antigen also ordered.   Electronic Signatures: Barnetta ChapelSkulskie, Robecca Fulgham (MD)  (Signed 15-May-13 12:48)  Authored: Chief Complaint, VITAL SIGNS/ANCILLARY NOTES, Brief Assessment, Lab Results, Assessment/Plan   Last Updated: 15-May-13 12:48 by Barnetta ChapelSkulskie, Shooter Tangen (MD)

## 2014-07-11 NOTE — Consult Note (Signed)
General Aspect 81yo female with history of dementia, chf, and chronic back pain admitted after presenting to the er with complaints of back pain and continued back pain.  she was noted to be hypoxic.  cxr revealed possible chf vs pneumonia.Marland Kitchen. difficult to assess symptoms due to confusion but noted to be yelling in pain due to back pain.  she has ruled out for an mi.  bnp increased.   Physical Exam:   GEN disheveled    HEENT PERRL    NECK supple    RESP no use of accessory muscles  rhonchi  crackles    CARD Regular rate and rhythm  Murmur    Murmur Systolic    Systolic Murmur axilla    ABD denies tenderness  normal BS  no Abdominal Bruits  no Adominal Mass    LYMPH negative neck    EXTR negative cyanosis/clubbing    SKIN normal to palpation    NEURO cranial nerves intact, motor/sensory function intact    PSYCH poor insight   Review of Systems:   Subjective/Chief Complaint difficult historian, complains of back pain    ROS Pt not able to provide ROS    Medications/Allergies Reviewed Medications/Allergies reviewed     PELVIC FRACTURE:    ALZHEIMERS:    Hiatal Hernia:    Hyperlipidemia:    hypertensive urgency:    Dementia:    osteoporosis:    back surg:   Home Medications: Medication Instructions Status  Biaxin 500 mg oral tablet 1 tab(s) orally 2 times a day (8am, 8pm) for 10 days.  **start date 08/05/11** Active  Protonix 40 mg oral delayed release tablet 1 tab(s) orally 2 times a day (8am, 8pm).  **do not crush** Active  busPIRone 5 mg oral tablet 1 tab(s) orally once a day at 2pm.  **brand name buspar** Active  Ferocon oral capsule 1 cap(s) orally 2 times a day (8am, 8pm).  **110-0.5mg ** Active  Fortical 200 intl units/inh nasal spray 1 spray(s) alternating nostrils once a day at 8am. Active  fluticasone 50 mcg inhalation powder 2 spray(s) in each nostril once a day at 8am.  **brand name flonase** Active  metoprolol tartrate 25 mg oral tablet 1 tab(s)  orally 2 times a day (8am, 8pm).  **brand name lopressorTheatre manager** Active  Oyster Shell Calcium with Vitamin D 500 mg-200 intl units oral tablet 1 tab(s) orally 2 times a day (8am, 8pm). **same as os-cal 500 +D** Active  donepezil 5 mg oral tablet 1 tab(s) orally once a day (at bedtime) at 8pm.  **brand name aricept** Active  risperidone 0.5 mg oral tablet, disintegrating 1 tab(s) orally once a day (at bedtime) at 8pm.  **brand name risperdal-m** Active  sertraline 50 mg oral tablet 1 tab(s) orally once a day at 8pm.  **brand name zoloft**  Active  tramadol 50 mg oral tablet 1 tab(s) orally every 8 hours as needed for pain.  **brand name ultram** Active  lorazepam 0.5 mg oral tablet 1 tab(s) orally every 4 hours as needed for anxiety/agitation **not to exceed 4 doses/24 hours** *brand name ativan* Active  Tussin DM 10 mg-100 mg/5 mL oral liquid 1 teaspoonful (5 milliliters) orally every 6 hours as needed for cough. Active   EKG:   EKG NSR    Abnormal NSSTTW changes    Aspirin: Unknown    Impression 81yo female with progressive dimentia admittedvafter presenting with progressive back pain and dementia . noted to be hypoxic and with increased wbc and bnp.  Noted to have probable chf.  ruled out for mi.echomreveals preserved lv function with lvh and diastollic dysfunction. ruled out for mi.  Appears tomhave chf withnpreserved systollic functin, ie diastollic failure    Plan 1. Carefull diuresis fpllowing renal function 2. Emperic treatment of back pain. 3. Cont beta blockers and ace i   Electronic Signatures: Dalia Heading (MD)  (Signed 23-May-13 21:40)  Authored: General Aspect/Present Illness, History and Physical Exam, Review of System, Past Medical History, Home Medications, EKG , Allergies, Impression/Plan   Last Updated: 23-May-13 21:40 by Dalia Heading (MD)

## 2014-07-11 NOTE — H&P (Signed)
PATIENT NAME:  Ashley Bender, KEIL MR#:  [id number] DATE OF BIRTH:  11-18-1920  DATE OF ADMISSION:  07/30/2011  PRIMARY CARE PHYSICIAN: Dr. Conchita Paris.   HISTORY: The history was mainly obtained from the ER physician, the records, and the patient's sister, who is healthcare power of attorney. The patient is a very poor historian. She is a resident of [facility] living facility. She was sent from [facility] because she fell last night and having lower back pain.   HISTORY OF PRESENT ILLNESS: This is an 81 year old female with history of dementia. She has a history of osteoporosis, arthritis, iron deficiency anemia, hypertension and she has been residing at [facility] for about four years now. As per the sister, she fell last night around 8:00 or 9:00 last night while she was taking a shower. She was complaining of mainly lower back pain. She had a bruising in the left flank area. But on further questioning, they say the patient has not been acting right since Saturday. They said that when she went to lunch she put her pants down which is unlike her. They also checked urinalysis at [facility] for which we do not have the record of. The patient had a CT scan and x-rays done in the Emergency Room because of the fall. Her x-ray of the pelvis shows that she had old fracture of the superior inferior pubic rami bilaterally. Her lumbar spine x-ray shows that she has an old vertebral compression fracture at L2 and then her urinalysis shows that she has 2+ leukocyte esterase with 20 WBCs per high-power field. Her urine culture has been sent and she has been given a dose of IV ceftriaxone in the Emergency Room. In the Emergency Room, when they tried to catheterize her, they found but that she had black stools on the sheet. They did a stool occult and that is positive. She is having melanotic stools. This is the first episode they found out in the Emergency Room. There was no mention of any melanotic stools at [facility] Her initial hemoglobin in the Emergency Room has been normal at 15.4 with a normal INR and normal BUN of 18. Hospitalist was asked to admit the patient because of altered mental status, urinary tract infection and possibility of upper gastrointestinal bleed. The patient already received a bolus of 80 mg of IV Protonix and has been started on Protonix drip here in the Emergency Room. As per the sister, there is no nausea or vomiting. No fever. On reviewing her previous records, she was admitted for microcytic anemia in 2010 and she had a flexible sigmoidoscopy done by Dr. Bluford Kaufmann in June 2010 which was suggestive of a large sessile rectal polyp, likely a tubulovillous adenoma. She had no prior history of ulcers. She denies any abdominal pain. The patient denies any pain anywhere at this time. She denies any chest pain, any shortness of breath.   REVIEW OF SYSTEMS: The patient is a very poor historian, very limited historian. She denies any chest pain, any shortness of breath, any abdominal pain, any nausea. She denies any back pain at this time. She denies any dysuria.   PAST MEDICAL HISTORY:  1. Hypertension. 2. Osteoporosis. 3. Dementia.  4. Intermittent sinus problems. 5. Arthritis. 6. Iron deficiency anemia. 7. She was admitted in May 2011 because of a left distal radius fracture and pelvic fracture.  8. Prior to that, she was admitted in June 2010 because of iron deficiency anemia secondary to possibly  large villous adenoma of the rectum.   ALLERGIES TO MEDICATIONS: None.   HOME MEDICATIONS: As per the [facility] received from [facility]:  1. Aspirin 81 mg daily.  2. Fluticasone nasal spray two sprays to each nostril every day.  3. Fortical nasal spray one spray alternating nostrils daily.  4. Buspirone 5 mg daily at 2:00 p.m.  5. Ferocon twice a day. 6. Metoprolol 25 mg twice a day.  7. Calcium with vitamin D twice daily. 8. Donepezil 5 mg at bedtime. 9. Risperidone ODT 0.25 mg at  bedtime. 10. Sertraline 50 mg daily.  11. Lorazepam 0.5 mg every four hours as needed for anxiety and agitation. 12. Tussin DM q.6 hours as needed for cough. 13. Tramadol 50 mg q.8 hours as needed for pain.   SOCIAL HISTORY: She is a resident of [facility] facility. She has been living there for four years. No history of smoking, alcohol, or drug use.   FAMILY HISTORY: As per the sister, both the parents had heart disease. They died of old age.   PHYSICAL EXAMINATION:  VITAL SIGNS: Temperature 97, heart rate 67, respiratory rate 20, blood pressure 128/63, sating 95% on room air.   GENERAL: This is an elderly Caucasian female, who is obese. She is comfortably lying in bed, no acute distress at this time.   HEENT: Bilateral pupils are equal. Extraocular muscles are intact. No scleral icterus. No conjunctivitis. Oral mucosa is slightly dry.   NECK: No thyroid tenderness, enlargement or nodule. Neck is supple. No masses, nontender. No adenopathy. No JVD. No carotid bruit.   CHEST: Bilateral breath sounds are clear. No wheeze. Normal effort. No respiratory distress.   HEART: Heart sounds are regular and tachycardic. No murmur. Good peripheral pulses. She has some mild lower extremity edema.   ABDOMEN: Abdomen appears to be soft, nontender. Normal bowel sounds. No hepatosplenomegaly. No bruit. No masses.   RECTAL: Exam is deferred at this time.  Neurologic She is awake. She has baseline dementia. She is not oriented to time, place, and person. She is pleasant. She is answering questions.   Cranial nerves are intact. She is moving bilateral upper and lower extremities against gravity.   EXTREMITIES: No cyanosis. No clubbing.   SKIN: She has mild lower extremity edema in the lower extremity.   LABORATORY, RADIOLOGICAL AND DIAGNOSTIC DATA: White count of 13,000, hemoglobin 15.4, platelet count 209,000. Her last hemoglobin was 11.9 in 2011. BMP: Sodium 141, potassium 4, BUN 18, creatinine  0.93. LFTs are normal. Glucose 100. CK 88. Troponin negative. Her INR is 0.9. Type and screen have been done already. Urinalysis shows 2+ leukocyte esterase, 20 WBCs per high-power field. Urine culture has been sent. Blood cultures have been sent. Chest x-ray showed no acute changes. Old 80% central and anterior vertebral compression deformity of one of the mid thoracic vertebral bodies and hiatal hernia. Pelvic x-ray shows old pelvic fracture of the superior inferior pubic rami bilaterally. A lumbar spine x-ray shows that she has old vertebral compression deformity at L2.   IMPRESSION: A complicated urinary tract infection with leukocytosis, maybe mild alteration of mental status at [facility], now baseline, melena, suspect upper gastrointestinal bleed, dementia, hypertension. She has osteoporosis with old bilateral pelvic fractures and she has old vertebral compression fractures. 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 She had a flexible sigmoidoscopy done in 2010.   PLAN: An 81 year old female who has history of hypertension, osteoporosis and dementia. She lives in [facility]. She was sent to the Emergency Room because she fell last night and she was complaining of lower back pain. She had lumbar x-rays done. She has pelvic x-rays done which shows old pelvic fractures bilaterally and she has vertebral compression fractures which appear to be old. She has some bruising in the left flank area. Also, during the course of the ER visit the family told the ER physician that the patient was not acting right since Saturday [facility]. Her, white count is slightly elevated and urinalysis is pyuric. The patient has urinary tract infection. Urine culture has been sent. She has been started on IV ceftriaxone. I am going to continue that. I will give her some gentle hydration also. Also, they found out that she is having melanotic stools here though her hemoglobin  and BUN are normal. We will continue the Protonix drip on her right now. We will do serial hemoglobins on her. Will get a gastroenterology consult on her. Her last gastrointestinal evaluation was done in June 2010 when she had a flexible sigmoidoscopy done which shows a large villous adenoma in the rectum and the biopsy at that time was suggestive of fragments of tubovillous adenoma. We will hold her aspirin at this time. The patient is also taking iron pills which can also make black stools. We are going to check a serial hemoglobin on her. I will continue donepezil, risperidone and sertraline for her dementia. We will also get a PT consult on her with the recent fall. She is taking [facility]. [facility]. We will continue that. I had a detailed discussion with the sister who is her healthcare power of attorney. The patient will be transferred to Dr. Fransico Settershaplin's service tonight.   TIME SPENT WITH ADMISSION AND COORDINATION OF CARE: 55 minutes.    ____________________________ Fredia SorrowAbhinav Samarie Pinder, MD ag:ap D: 07/30/2011 20:30:56 ET T: 07/31/2011 07:00:41 ET JOB#: [id number] cc: Fredia SorrowAbhinav Kavontae Pritchard, MD, <Dictator> Jimmie Mollyon C. Candelaria Stagershaplin, MD Fredia SorrowABHINAV Joseeduardo Brix MD ELECTRONICALLY SIGNED 08/26/2011 12:37

## 2014-07-11 NOTE — Consult Note (Signed)
PATIENT NAME:  Ashley Bender, Ashley Bender MR#:  [id number] DATE OF BIRTH:  July 15, 1920  DATE OF CONSULTATION:  07/31/2011  REFERRING PHYSICIAN:   CONSULTING PHYSICIAN:  Keturah Barre, NP  PRIMARY CARE PHYSICIAN: Conchita Paris, MD  HISTORY OF PRESENT ILLNESS: Ashley Bender is an 81 year old Caucasian female with a history of osteoporosis with vertebral compression fractures, advanced dementia, adenomatous polyps, hypertension, and iron deficiency anemia who was admitted for altered mental status, failure to thrive, and urinary tract infection. Please see admission history and physical for full details. Gastroenterology has been consulted at the request of Dr. Fredia Sorrow to evaluate melanotic stools. The patient is a poor historian due to her mental status and confusion. Her friend, Clide Cliff, is at the bedside who visits with her regularly. Evidently the patient was found to have a black stool last night in the emergency department. According to her friend, Clide Cliff, the patient has been without signs of abdominal pain, nausea, vomiting, constipation, and other GI issues. She is unsure if there were black stools prior to that. The patient did have a flexible sigmoidoscopy in 2010 by Dr. Bluford Kaufmann with a large sessile rectal polyp, an adenoma. She has no prior history of ulcers. She is currently denying any abdominal pain, nausea, vomiting and other complaints.  REVIEW OF SYSTEMS: Difficult to ascertain due to confusion and mental status, some information was gained from friend Laverne, but family absent at the time of my evaluation.Other information gathered from the admission [facility] & P.  PAST MEDICAL HISTORY:  1. Hypertension. 2. Osteoporosis. 3. Dementia. 4. Intermittent sinus problems. 5. Arthritis. 6. Iron deficiency anemia. 7. Left distal radius fracture and pelvic fracture in 2011. 8. Adenomatous polyps.   ALLERGIES: No known drug allergies.   SOCIAL HISTORY: Resident of [facility] facility for the past  four years. No tobacco, alcohol, or illicits.   FAMILY HISTORY: Parents with heart disease.  HOME MEDICATIONS: (Per the [facility] from [facility]) 1. ASA 81 mg p.o. daily.  2. Fluticasone nasal spray two sprays to each naris daily.  3. Fortical nasal spray one spray to alternate nostril daily. 4. BuSpar 5 mg daily. 5. Ferocon twice a day.  6. Metoprolol 25 mg p.o. twice a day. 7. Calcium with D p.o. twice a day. 8. Donepezil 5 mg at bedtime.  9. Risperidone ODT 0.25 mg p.o. at bedtime.  10. Sertraline 50 mg p.o. daily.  11. Lorazepam 0.5 mg every four hours p.r.n. anxiety or agitation. 12. Tussin DM p.o. every 6 hours p.r.n. cough.  13. Tramadol 50 mg p.o. every 8 hours p.r.n. pain.   LABS/STUDIES: Most recent lab work: Glucose 109, BUN 18, creatinine 0.89, sodium 141, potassium 3.7, chloride 107, GFR 58, calcium 8.1. Liver panel is normal. CPK-MB and troponins have been normal. WBC 8.9, hemoglobin 13.8, hematocrit 41.3, platelets 183. Red cells are normocytic. Her RDW is normocytic. PT is 12.9. INR is 0.9.   CT of the head was negative for acute process.   Chest x-ray demonstrated old compression fracture in the midthoracic vertebra, otherwise was negative for acute chest abnormalities.   Lumbar and pelvic x-rays did demonstrate some vertebral disk disease and an old pelvic fracture.   PHYSICAL EXAMINATION:   VITAL SIGNS: Most recent vital signs: Temperature 97.6, pulse 76, respiratory rate 18, blood pressure 113/78, and oxygen saturation 95% on room air.   GENERAL: Elderly Caucasian female who appears comfortable in bed, no acute distress.   PSYCHIATRIC: Confused, pleasant, mostly cooperative.   HEENT: Normocephalic, atraumatic.  No redness, drainage, or inflammation to the eyes or nares. Oral mucous membranes are pink and moist.   NECK: No thyromegaly, JVD, or adenopathy.   RESPIRATORY: Respirations eupneic. Lungs clear to auscultation and percussion.   CARDIAC: S1 and S2. No  murmurs, rubs, or gallops. Regular rate and rhythm. Peripheral pulses 2+. 1+ lower extremity bilateral edema.   ABDOMEN: Nondistended. Bowel sounds x4. Soft. Epigastric tenderness. No peritoneal signs, rebound tenderness, guarding, masses, hepatosplenomegaly, or hernias.   RECTAL: No obvious abnormalities. Stool black, heme positive.   GENITOURINARY: Deferred.   EXTREMITIES: No clubbing or cyanosis. Strength 5/5. Sensation appears to be intact.   NEUROLOGICAL: Cranial nerves II through XII grossly intact. Alert and oriented x1, however, she knows her friend. Speech clear. No facial droop   SKIN: No erythema, lesion, or rash.   IMPRESSION AND PLAN: Likely upper GI bleed. Her hemoglobin, platelet count, and PT values are normal. She is currently being maintained on a pantoprazole drip. Since my evaluation of the patient, I have spoken with Dr. Marva PandaSkulskie who has also seen the patient. Her daughter, [name], was present at the time of his visit and wishes to pursue less aggressive intervention at this time due to her advanced dementia, risks of sedating procedures, and other health problems. Therefore, we will treat her empirically at this time, check her stools for H. pylori, continue her pantoprazole drip, and continue to recommend twice a day hemoglobins. She should also be transfused as needed. We will follow closely. Thank you for this consult.        These services were provided by Vevelyn Pathristiane Annmargaret Decaprio, MSN, [facility] in collaboration with Dr. Barnetta ChapelMartin Skulskie with whom I have discussed the patient in full.  ____________________________ Keturah Barrehristiane H. Adrina Armijo, NP chl:slb D: 07/31/2011 15:26:21 ET T: 07/31/2011 16:00:26 ET JOB#: [id number]  cc: Keturah Barrehristiane H. Tamanna Whitson, NP, <Dictator> Jimmie Mollyon C. Candelaria Stagershaplin, MD [facility] H Arneta Mahmood FNP ELECTRONICALLY SIGNED 08/01/2011 8:51

## 2014-07-11 NOTE — Consult Note (Signed)
PATIENT NAME:  Ashley Bender, Ashley Bender MR#:  [id number] DATE OF BIRTH:  1921/01/10  DATE OF CONSULTATION:  08/10/2011  REFERRING PHYSICIAN:  Camillo FlamingKamran Lateef, MD  CONSULTING PHYSICIAN:  Adelene AmasJames S. Makar Slatter, MD  REASON FOR CONSULTATION: Psychosis, confusion.   HISTORY OF PRESENT ILLNESS: Ashley Bender year-old is an 81 year old widowed female admitted [facility] the [facility] on 08/08/2011 due to congestive heart failure and possible pneumonia.   An infiltrate in her left lower lobe has been confirmed. She does have a baseline of dementia with short-term memory loss, however, over the past few weeks she has been demonstrating progressive increased confusion with hallucinations at night. She also has been reporting decreased energy as well as depressed mood. She has impairment in orientation as well as memory function. She has clouding of consciousness. Her hallucinations become worse in the evening.   In addition to hallucinations at night, Ashley Bender has been experiencing severe agitation.    PAST PSYCHIATRIC HISTORY: As mentioned, she does have a history of baseline dementia. She also has a history of major depression as well as excessive worry, feeling on edge, and muscle tension.   MEDICATIONS ON ADMISSION: 1. Buspirone 5 mg daily. 2. Aricept 5 mg daily. 3. Ativan 0.5 mg q.4 hours p.Bender.n.  4. Risperdal 0.5 mg at bedtime. 5. Sertraline 50 mg daily.  Those medications have been continued.   FAMILY PSYCHIATRIC HISTORY: None known.   SOCIAL HISTORY: [place] She had one brother and an unknown number of sisters. She is now widowed. Her brother-in-law has been looking in on her. She also has a daughter who looks in on her.   OCCUPATION: Retired. She does not have any history of tobacco, alcohol, or illegal drug use. [facility].   PAST MEDICAL HISTORY: 1. Osteoporosis. 2. Sinusitis. 3. Urinary tract  infection. 4. Failure to thrive.   5. Progressive dementia. 6. Iron deficiency anemia.  7. History of villous adenoma of the rectum. 8. History of left distal radial fracture and pelvic fracture. 9. Hypertension.   ALLERGIES: Aspirin.   MEDICATIONS: The MAR is reviewed. Psychotropics as above. She also has been placed on Haldol today 0.25 mg q.4 hours.   LABORATORY, DIAGNOSTIC, AND RADIOLOGICAL DATA: Head CT, SGOT, SGPT, CBC, TSH, BUN, and creatinine all unremarkable.   Albumin was low at 2.5. EKG QTc on May 22nd was 465 ms.   REVIEW OF SYSTEMS: Constitutional, HEENT, mouth, neurologic, psychiatric, cardiovascular, respiratory, gastrointestinal, genitourinary, skin, musculoskeletal, hematologic, lymphatic, endocrine, metabolic all unremarkable.   PHYSICAL EXAMINATION:   VITAL SIGNS: Temperature 98.3, pulse 83, respiratory rate 20, blood pressure 128/86.   GENERAL APPEARANCE: Ashley Bender is a well-developed, well-nourished elderly female lying in a right lateral decubitus position in her hospital bed with no abnormal involuntary movement. Her muscle tone is mildly decreased. She does not have any cachexia. Her grooming and hygiene are normal.   MENTAL STATUS EXAM: Ashley Bender has slight clouding of consciousness. Her eye contact is good. On orientation testing she did not name the year, month, day of the month, or day of the week. She does not know where she is. She does not know what kind of building she's in. She is oriented to person. A close long-term friend of hers has visited in the room and she does not recall her name. She speaks of her husband as if he's still alive. 3 out of 3 objects given immediately and 0 out  of 3 were recalled at five minutes. Her fund of knowledge, use of language and intelligence are estimated at grossly and severely below her premorbid baseline. Her speech is soft. There is a mildly flat prosody. There is no dysarthria. Thought process involves intermittent  coherent phrases mixed in with confabulation. Thought content there are no thoughts of harming herself or others. There are no hallucinations or delusions at the time of the exam. Insight is poor. Judgment impaired. Affect constricted. Mood depressed.   ASSESSMENT:  AXIS I:  1. Delirium due to general medical conditions. 2. Dementia, not otherwise specified. 3. Major depressive disorder, recurrent, moderate.  4. Generalized anxiety disorder.   AXIS II: None.   AXIS III: See past medical history. She has an acute left lower lobe infiltrate.   AXIS IV: General medical.   AXIS V: 20.   RECOMMENDATIONS: If Ms. Menefee continues with her delirium symptoms would complete the reversible etiology central nervous system work-up with an assessment of B12, folic acid, and RPR.   Zyprexa can provide acute anti-agitation as a side effect as well as anti-psychosis. Would start at 5 mg p.o. or IM q.p.m. and would give it at the same time as the Zoloft.   The Zyprexa will also augment Zoloft in anti-depression.   No change in buspirone.   Her Risperdal has been discontinued by the undersigned.   Would continue to reinforce memory cues as well as orientation cues and provide a low stimulation ego supportive environment.   ____________________________ Adelene Amas. Amiah Frohlich, MD jsw:drc D: 08/10/2011 19:23:29 ET T: 08/11/2011 07:53:35 ET JOB#: [id number]  cc: Adelene Amas. Starkisha Tullis, MD, <Dictator> [place] MD ELECTRONICALLY SIGNED 08/14/2011 15:53

## 2014-07-11 NOTE — Discharge Summary (Signed)
PATIENT NAME:  Ashley SessionsGARRETT, Lexxi R MR#:  [id number] DATE OF BIRTH:  May 01, 1920  DATE OF ADMISSION:  08/08/2011 DATE OF DISCHARGE:  08/14/2011  HISTORY/HOSPITAL COURSE: [name] who was recently hospitalized here for failure to thrive and dementia was readmitted for hypoxia and weakness and degenerative arthritis. She was found to have evidence of acute respiratory failure, initially thought maybe on the basis of unsuspected congestive heart failure, but it turned out to be more of a pneumonia involving her left lung area. She was treated with broad-spectrum antibiotics and she had received some diuretics early on.  She remained quite delirious with her dementia and was seen by a psychiatrist to try to help adjust her medications. She remained unable, unwilling to take in adequate p.o. nourishment. Despite better efforts it was not successful. Her hypoxia did clear with treatment with the antibiotics and the respiratory therapy. She was seen and screened by palliative care and eventually by hospice and felt that she was hospice home appropriate.   At the time of discharge, her blood pressure was 130/60, her oximetry was 91% on room air, and she was afebrile. She had taken in no p.o. nourishment for 72 hours. She clinically seemed to be able to do so, except for generalized weakness. She was discharged on the hospice home medications.   DISCHARGE DIAGNOSES:  1. Acute respiratory failure secondary to pneumonia. 2. Age progressive dementia with delirium.  3. Degenerative arthritis syndrome. 4. She may have had a component of systolic congestive heart failure, but her primary issue was her pneumonia.  5. Previous history of hypertensive cardiovascular disease.           6. Chronic mild iron deficiency anemia.   OVERALL PROGNOSIS: Expected to be poor.  ____________________________ Jimmie Mollyon C. Candelaria Stagershaplin, MD dcc:slb D: 08/16/2011 08:43:42 ET T: 08/16/2011 15:04:44 ET JOB#: [id number]  cc: Don C.  Candelaria Stagershaplin, MD, <Dictator> Virl AxeN C [place] MD ELECTRONICALLY SIGNED 08/16/2011 17:35

## 2014-07-11 NOTE — Consult Note (Signed)
Chief Complaint:   Subjective/Chief Complaint Patient seen and examined, please see full GI consult. Patietn admitted after a fall with UTI and some mild mental status changes on a background of baseline dementia.  On evaluation she was noted to have a black stool, heme positive. No sx of n/v or abd pain and BUN is normal.   There is some left sided abdominal discomfort with some voluntary guarding.  Patient has been hemodynamically stable, with stable hgb since admission. Now on ppi iv.  I discussed patient with her POA/sister, Abel Prestonn Isley.   In the past family made the decision to not pursue repeated colnoscopy or surgery for a large rectal polyp that was evaluated by Dr Bluford Kaufmannh.  At this point family also wish to follow this current situation conservatively.  Per those wishes, will not do EGD, will continue iv ppi, bid hgb, transfuse as needed.  Also will get h. pylori serology, treat if indicated.   Will follow with you.   VITAL SIGNS/ANCILLARY NOTES: **Vital Signs.:   14-May-13 10:42   Temperature Temperature (F) 97.6   Celsius 36.4   Temperature Source oral   Pulse Pulse 76   Respirations Respirations 18   Systolic BP Systolic BP 113   Diastolic BP (mmHg) Diastolic BP (mmHg) 78   Mean BP 89   Pulse Ox % Pulse Ox % 95   Pulse Ox Activity Level  At rest   Oxygen Delivery 2L   Electronic Signatures: Barnetta ChapelSkulskie, Martin (MD)  (Signed 14-May-13 16:43)  Authored: Chief Complaint, VITAL SIGNS/ANCILLARY NOTES   Last Updated: 14-May-13 16:43 by Barnetta ChapelSkulskie, Martin (MD)

## 2014-07-11 NOTE — Consult Note (Signed)
Brief Consult Note: Diagnosis: AMS, UTI.   Patient was seen by consultant.   Consult note dictated.   Comments: Appreciate consult for 81 y/o caucasian woman with history of advanced dementia, adenomatous polyps,htn, osteoporosis with vertebral compression fractures, admitted for AMS/FTT/UTI for melanotic stools.  Patient is poor historian due to mental status/confusion. Did speak to friend Ashley Bender who was in room and visits with patient regularly: evidentally the patient has been without signs of abdominal pain, NVD, constipation- did have a black stool last night in the ED, but unsure if black stools prior to that. Patient does complain of epigastric tenderness to palpation, otherwise states she feels well, and denies complaints.  On rectal exam, her stool is black and heme positive. She is a resident [facility] [address]. Hemoglobin/platelet count/pt values are normal,  she is currently on a Pantoprazole drip. Impression and Plan: Upper Gi bleed. Since my evaluation of patient, I have spoken with Dr Marva PandaSkulskie, who has also seen patient. Her daughter Ashley Bender was present at the time and wishes to pursue less aggressive interventions at this time due to her advanced dementia and other health problems. Therefore, we will treat her empirically at this time, and  check her stool for H pylori, continue the Pantoprazole drip for now, and continue to recommend bid hemoglobins. She should also be transfused as needed. We will follow closely.  Electronic Signatures: Vevelyn PatLondon, Nakeda Lebron H (NP)  (Signed 14-May-13 15:17)  Authored: Brief Consult Note   Last Updated: 14-May-13 15:17 by Keturah BarreLondon, Covey Baller H (NP)
